# Patient Record
Sex: Female | Born: 1935
Health system: Southern US, Community
[De-identification: ages and names within clinical notes are randomized; demographics above are authoritative.]

## PROBLEM LIST (undated history)

## (undated) DIAGNOSIS — M1990 Unspecified osteoarthritis, unspecified site: Secondary | ICD-10-CM

## (undated) DIAGNOSIS — I10 Essential (primary) hypertension: Secondary | ICD-10-CM

## (undated) DIAGNOSIS — I82409 Acute embolism and thrombosis of unspecified deep veins of unspecified lower extremity: Secondary | ICD-10-CM

## (undated) HISTORY — PX: OTHER SURGICAL HISTORY: SHX169

## (undated) HISTORY — DX: Essential (primary) hypertension: I10

---

## 1973-07-15 HISTORY — PX: CERVICAL DISC SURGERY: SHX588

## 1974-07-15 HISTORY — PX: OTHER SURGICAL HISTORY: SHX169

## 1998-10-31 ENCOUNTER — Other Ambulatory Visit: Admission: RE | Admit: 1998-10-31 | Discharge: 1998-10-31 | Payer: Self-pay | Admitting: Obstetrics and Gynecology

## 1999-10-31 ENCOUNTER — Other Ambulatory Visit: Admission: RE | Admit: 1999-10-31 | Discharge: 1999-10-31 | Payer: Self-pay | Admitting: Obstetrics and Gynecology

## 2000-03-14 ENCOUNTER — Encounter: Admission: RE | Admit: 2000-03-14 | Discharge: 2000-03-14 | Payer: Self-pay | Admitting: *Deleted

## 2000-11-03 ENCOUNTER — Encounter (INDEPENDENT_AMBULATORY_CARE_PROVIDER_SITE_OTHER): Payer: Self-pay | Admitting: Specialist

## 2000-11-03 ENCOUNTER — Other Ambulatory Visit: Admission: RE | Admit: 2000-11-03 | Discharge: 2000-11-03 | Payer: Self-pay | Admitting: Obstetrics and Gynecology

## 2001-03-31 ENCOUNTER — Encounter: Payer: Self-pay | Admitting: Obstetrics and Gynecology

## 2001-03-31 ENCOUNTER — Encounter: Admission: RE | Admit: 2001-03-31 | Discharge: 2001-03-31 | Payer: Self-pay | Admitting: Obstetrics and Gynecology

## 2002-04-01 ENCOUNTER — Encounter: Payer: Self-pay | Admitting: Obstetrics and Gynecology

## 2002-04-01 ENCOUNTER — Encounter: Admission: RE | Admit: 2002-04-01 | Discharge: 2002-04-01 | Payer: Self-pay | Admitting: Obstetrics and Gynecology

## 2003-04-05 ENCOUNTER — Encounter: Payer: Self-pay | Admitting: Obstetrics and Gynecology

## 2003-04-05 ENCOUNTER — Ambulatory Visit (HOSPITAL_COMMUNITY): Admission: RE | Admit: 2003-04-05 | Discharge: 2003-04-05 | Payer: Self-pay | Admitting: Obstetrics and Gynecology

## 2004-04-10 ENCOUNTER — Ambulatory Visit (HOSPITAL_COMMUNITY): Admission: RE | Admit: 2004-04-10 | Discharge: 2004-04-10 | Payer: Self-pay | Admitting: Family Medicine

## 2005-05-30 ENCOUNTER — Ambulatory Visit (HOSPITAL_COMMUNITY): Admission: RE | Admit: 2005-05-30 | Discharge: 2005-05-30 | Payer: Self-pay | Admitting: Family Medicine

## 2006-06-02 ENCOUNTER — Ambulatory Visit (HOSPITAL_COMMUNITY): Admission: RE | Admit: 2006-06-02 | Discharge: 2006-06-02 | Payer: Self-pay | Admitting: Obstetrics and Gynecology

## 2006-10-21 ENCOUNTER — Other Ambulatory Visit: Admission: RE | Admit: 2006-10-21 | Discharge: 2006-10-21 | Payer: Self-pay | Admitting: Obstetrics and Gynecology

## 2007-06-08 ENCOUNTER — Ambulatory Visit (HOSPITAL_COMMUNITY): Admission: RE | Admit: 2007-06-08 | Discharge: 2007-06-08 | Payer: Self-pay | Admitting: Family Medicine

## 2008-04-01 ENCOUNTER — Ambulatory Visit: Payer: Self-pay | Admitting: Internal Medicine

## 2008-04-15 ENCOUNTER — Encounter: Payer: Self-pay | Admitting: Internal Medicine

## 2008-04-15 ENCOUNTER — Ambulatory Visit: Payer: Self-pay | Admitting: Internal Medicine

## 2008-04-18 ENCOUNTER — Encounter: Payer: Self-pay | Admitting: Internal Medicine

## 2008-06-13 ENCOUNTER — Ambulatory Visit (HOSPITAL_COMMUNITY): Admission: RE | Admit: 2008-06-13 | Discharge: 2008-06-13 | Payer: Self-pay | Admitting: Obstetrics and Gynecology

## 2009-06-16 ENCOUNTER — Ambulatory Visit (HOSPITAL_COMMUNITY): Admission: RE | Admit: 2009-06-16 | Discharge: 2009-06-16 | Payer: Self-pay | Admitting: Obstetrics and Gynecology

## 2009-07-12 ENCOUNTER — Ambulatory Visit: Payer: Self-pay | Admitting: Obstetrics and Gynecology

## 2009-07-12 ENCOUNTER — Other Ambulatory Visit: Admission: RE | Admit: 2009-07-12 | Discharge: 2009-07-12 | Payer: Self-pay | Admitting: Obstetrics and Gynecology

## 2009-09-20 ENCOUNTER — Emergency Department (HOSPITAL_BASED_OUTPATIENT_CLINIC_OR_DEPARTMENT_OTHER): Admission: EM | Admit: 2009-09-20 | Discharge: 2009-09-20 | Payer: Self-pay | Admitting: Emergency Medicine

## 2010-07-17 ENCOUNTER — Ambulatory Visit (HOSPITAL_COMMUNITY): Admission: RE | Admit: 2010-07-17 | Payer: Self-pay | Source: Home / Self Care | Admitting: Obstetrics and Gynecology

## 2010-08-09 ENCOUNTER — Other Ambulatory Visit: Payer: Self-pay | Admitting: Obstetrics and Gynecology

## 2010-08-09 DIAGNOSIS — Z139 Encounter for screening, unspecified: Secondary | ICD-10-CM

## 2010-08-14 ENCOUNTER — Ambulatory Visit (HOSPITAL_COMMUNITY)
Admission: RE | Admit: 2010-08-14 | Discharge: 2010-08-14 | Payer: Self-pay | Source: Home / Self Care | Attending: Obstetrics and Gynecology | Admitting: Obstetrics and Gynecology

## 2010-08-15 ENCOUNTER — Encounter: Payer: Self-pay | Admitting: Obstetrics and Gynecology

## 2010-10-08 LAB — CBC
MCV: 91 fL (ref 78.0–100.0)
RBC: 4.43 MIL/uL (ref 3.87–5.11)
WBC: 6.1 10*3/uL (ref 4.0–10.5)

## 2010-10-08 LAB — URINALYSIS, ROUTINE W REFLEX MICROSCOPIC
Bilirubin Urine: NEGATIVE
Hgb urine dipstick: NEGATIVE
Ketones, ur: NEGATIVE mg/dL
Specific Gravity, Urine: 1.014 (ref 1.005–1.030)

## 2010-10-08 LAB — COMPREHENSIVE METABOLIC PANEL
AST: 25 U/L (ref 0–37)
Alkaline Phosphatase: 99 U/L (ref 39–117)
BUN: 17 mg/dL (ref 6–23)
Chloride: 97 mEq/L (ref 96–112)
GFR calc Af Amer: >60 mL/min (ref >60)
Potassium: 4.4 mEq/L (ref 3.5–5.1)
Total Bilirubin: 0.8 mg/dL (ref 0.3–1.2)

## 2010-10-08 LAB — DIFFERENTIAL
Basophils Absolute: 0 10*3/uL (ref 0.0–0.1)
Lymphocytes Relative: 19 % (ref 12–46)
Lymphs Abs: 1.1 10*3/uL (ref 0.7–4.0)
Monocytes Relative: 5 % (ref 3–12)
Neutro Abs: 4.4 10*3/uL (ref 1.7–7.7)
Neutrophils Relative %: 73 % (ref 43–77)

## 2010-10-08 LAB — POCT CARDIAC MARKERS
CKMB, poc: 1.1 ng/mL (ref 1.0–8.0)
Troponin i, poc: <0.05 ng/mL (ref 0.00–0.09)

## 2011-07-30 DIAGNOSIS — J209 Acute bronchitis, unspecified: Secondary | ICD-10-CM | POA: Diagnosis not present

## 2011-07-30 DIAGNOSIS — Z1331 Encounter for screening for depression: Secondary | ICD-10-CM | POA: Diagnosis not present

## 2011-08-30 ENCOUNTER — Other Ambulatory Visit (HOSPITAL_COMMUNITY): Payer: Self-pay | Admitting: Family Medicine

## 2011-08-30 DIAGNOSIS — Z1231 Encounter for screening mammogram for malignant neoplasm of breast: Secondary | ICD-10-CM

## 2011-09-05 ENCOUNTER — Ambulatory Visit (HOSPITAL_COMMUNITY)
Admission: RE | Admit: 2011-09-05 | Discharge: 2011-09-05 | Disposition: A | Discharge status: Home / Self Care | Payer: Medicare Other | Source: Ambulatory Visit | Attending: Family Medicine | Admitting: Family Medicine

## 2011-09-05 DIAGNOSIS — Z1231 Encounter for screening mammogram for malignant neoplasm of breast: Secondary | ICD-10-CM | POA: Insufficient documentation

## 2011-10-04 DIAGNOSIS — K5900 Constipation, unspecified: Secondary | ICD-10-CM | POA: Diagnosis not present

## 2011-10-04 DIAGNOSIS — I10 Essential (primary) hypertension: Secondary | ICD-10-CM | POA: Diagnosis not present

## 2011-10-04 DIAGNOSIS — Z780 Asymptomatic menopausal state: Secondary | ICD-10-CM | POA: Diagnosis not present

## 2011-10-29 DIAGNOSIS — Z780 Asymptomatic menopausal state: Secondary | ICD-10-CM | POA: Diagnosis not present

## 2012-01-17 DIAGNOSIS — H04129 Dry eye syndrome of unspecified lacrimal gland: Secondary | ICD-10-CM | POA: Diagnosis not present

## 2012-01-17 DIAGNOSIS — H2510 Age-related nuclear cataract, unspecified eye: Secondary | ICD-10-CM | POA: Diagnosis not present

## 2012-03-30 DIAGNOSIS — Z23 Encounter for immunization: Secondary | ICD-10-CM | POA: Diagnosis not present

## 2012-03-30 DIAGNOSIS — I10 Essential (primary) hypertension: Secondary | ICD-10-CM | POA: Diagnosis not present

## 2012-03-30 DIAGNOSIS — F411 Generalized anxiety disorder: Secondary | ICD-10-CM | POA: Diagnosis not present

## 2012-09-09 ENCOUNTER — Other Ambulatory Visit (HOSPITAL_COMMUNITY): Payer: Self-pay | Admitting: Family Medicine

## 2012-09-09 DIAGNOSIS — Z803 Family history of malignant neoplasm of breast: Secondary | ICD-10-CM

## 2012-09-16 ENCOUNTER — Ambulatory Visit (HOSPITAL_COMMUNITY)
Admission: RE | Admit: 2012-09-16 | Discharge: 2012-09-16 | Disposition: A | Discharge status: Home / Self Care | Payer: Medicare Other | Source: Ambulatory Visit | Attending: Family Medicine | Admitting: Family Medicine

## 2012-09-16 DIAGNOSIS — Z803 Family history of malignant neoplasm of breast: Secondary | ICD-10-CM

## 2012-09-16 DIAGNOSIS — Z1231 Encounter for screening mammogram for malignant neoplasm of breast: Secondary | ICD-10-CM | POA: Diagnosis not present

## 2012-09-28 DIAGNOSIS — M1710 Unilateral primary osteoarthritis, unspecified knee: Secondary | ICD-10-CM | POA: Diagnosis not present

## 2012-09-28 DIAGNOSIS — IMO0002 Reserved for concepts with insufficient information to code with codable children: Secondary | ICD-10-CM | POA: Diagnosis not present

## 2012-09-28 DIAGNOSIS — I10 Essential (primary) hypertension: Secondary | ICD-10-CM | POA: Diagnosis not present

## 2013-03-02 ENCOUNTER — Encounter: Payer: Self-pay | Admitting: Internal Medicine

## 2013-03-04 ENCOUNTER — Encounter: Payer: Self-pay | Admitting: Internal Medicine

## 2013-04-05 DIAGNOSIS — I10 Essential (primary) hypertension: Secondary | ICD-10-CM | POA: Diagnosis not present

## 2013-04-05 DIAGNOSIS — E7800 Pure hypercholesterolemia, unspecified: Secondary | ICD-10-CM | POA: Diagnosis not present

## 2013-04-05 DIAGNOSIS — IMO0002 Reserved for concepts with insufficient information to code with codable children: Secondary | ICD-10-CM | POA: Diagnosis not present

## 2013-04-05 DIAGNOSIS — M1710 Unilateral primary osteoarthritis, unspecified knee: Secondary | ICD-10-CM | POA: Diagnosis not present

## 2013-04-05 DIAGNOSIS — Z23 Encounter for immunization: Secondary | ICD-10-CM | POA: Diagnosis not present

## 2013-04-26 ENCOUNTER — Ambulatory Visit (AMBULATORY_SURGERY_CENTER): Payer: Self-pay

## 2013-04-26 VITALS — Ht 68.0 in | Wt 174.0 lb

## 2013-04-26 DIAGNOSIS — Z860100 Personal history of colon polyps, unspecified: Secondary | ICD-10-CM

## 2013-04-26 MED ORDER — MOVIPREP 100 G PO SOLR: 1.0000 | Freq: Once | ORAL | Status: DC

## 2013-05-20 ENCOUNTER — Telehealth: Payer: Self-pay | Admitting: Internal Medicine

## 2013-05-20 NOTE — Telephone Encounter (Signed)
No answer. Will call pt back

## 2013-05-20 NOTE — Telephone Encounter (Signed)
Questions were answered about medications to take day of procedure.

## 2013-05-21 ENCOUNTER — Encounter: Payer: PRIVATE HEALTH INSURANCE | Admitting: Internal Medicine

## 2013-05-25 ENCOUNTER — Ambulatory Visit (AMBULATORY_SURGERY_CENTER): Payer: Medicare Other | Admitting: Internal Medicine

## 2013-05-25 ENCOUNTER — Encounter: Payer: Self-pay | Admitting: Internal Medicine

## 2013-05-25 VITALS — BP 161/81 | HR 67 | Temp 97.8°F | Resp 21 | Ht 68.0 in | Wt 174.0 lb

## 2013-05-25 DIAGNOSIS — Z86010 Personal history of colonic polyps: Secondary | ICD-10-CM

## 2013-05-25 DIAGNOSIS — I10 Essential (primary) hypertension: Secondary | ICD-10-CM | POA: Diagnosis not present

## 2013-05-25 DIAGNOSIS — D126 Benign neoplasm of colon, unspecified: Secondary | ICD-10-CM | POA: Diagnosis not present

## 2013-05-25 MED ORDER — SODIUM CHLORIDE 0.9 % IV SOLN: 500.0000 mL | INTRAVENOUS | Status: DC

## 2013-05-25 NOTE — Progress Notes (Signed)
Called to room to assist during endoscopic procedure.  Patient ID and intended procedure confirmed with present staff. Received instructions for my participation in the procedure from the performing physician.  

## 2013-05-25 NOTE — Patient Instructions (Addendum)
YOU HAD AN ENDOSCOPIC PROCEDURE TODAY AT THE [facility]: Refer to the procedure report that was given to you for any specific questions about what was found during the examination.  If the procedure report does not answer your questions, please call your gastroenterologist to clarify.  If you requested that your care partner not be given the details of your procedure findings, then the procedure report has been included in a sealed envelope for you to review at your convenience later.  YOU SHOULD EXPECT: Some feelings of bloating in the abdomen. Passage of more gas than usual.  Walking can help get rid of the air that was put into your GI tract during the procedure and reduce the bloating. If you had a lower endoscopy (such as a colonoscopy or flexible sigmoidoscopy) you may notice spotting of blood in your stool or on the toilet paper. If you underwent a bowel prep for your procedure, then you may not have a normal bowel movement for a few days.  DIET: Your first meal following the procedure should be a light meal and then it is ok to progress to your normal diet.  A half-sandwich or bowl of soup is an example of a good first meal.  Heavy or fried foods are harder to digest and may make you feel nauseous or bloated.  Likewise meals heavy in dairy and vegetables can cause extra gas to form and this can also increase the bloating.  Drink plenty of fluids but you should avoid alcoholic beverages for 24 hours.  ACTIVITY: Your care partner should take you home directly after the procedure.  You should plan to take it easy, moving slowly for the rest of the day.  You can resume normal activity the day after the procedure however you should NOT DRIVE or use heavy machinery for 24 hours (because of the sedation medicines used during the test).    SYMPTOMS TO REPORT IMMEDIATELY: A gastroenterologist can be reached at any hour.  During normal business hours, 8:30 AM to 5:00 PM Monday through Friday,  call [phone].  After hours and on weekends, please call the GI answering service at [phone]  Emergency number who will take a message and have the physician on call contact you.   Following lower endoscopy (colonoscopy or flexible sigmoidoscopy):  Excessive amounts of blood in the stool  Significant tenderness or worsening of abdominal pains  Swelling of the abdomen that is new, acute  Fever of 100F or higher  FOLLOW UP: If any biopsies were taken you will be contacted by phone or by letter within the next 1-3 weeks.  Call your gastroenterologist if you have not heard about the biopsies in 3 weeks.  Our staff will call the home number listed on your records the next business day following your procedure to check on you and address any questions or concerns that you may have at that time regarding the information given to you following your procedure. This is a courtesy call and so if there is no answer at the home number and we have not heard from you through the emergency physician on call, we will assume that you have returned to your regular daily activities without incident.  SIGNATURES/CONFIDENTIALITY: You and/or your care partner have signed paperwork which will be entered into your electronic medical record.  These signatures attest to the fact that that the information above on your After Visit Summary has been reviewed and is understood.  Full responsibility of the   confidentiality of this discharge information lies with you and/or your care-partner.  high fiber diet handout  No recall colonoscopy due to age

## 2013-05-25 NOTE — Op Note (Signed)
[facility] [address] [place], [address]   COLONOSCOPY PROCEDURE REPORT  PATIENT: Michelle, Mosley  MR#: [id number] BIRTHDATE: 1932/08/01 , 80  yrs. old GENDER: Female ENDOSCOPIST: Hart Carwin, MD REFERRED XB:JYNWGN Foy [place], M.D. PROCEDURE DATE:  05/25/2013 PROCEDURE:   Colonoscopy with snare polypectomy First Screening Colonoscopy - Avg.  risk and is 50 yrs.  old or older - No.  Prior Negative Screening - Now for repeat screening. N/A  History of Adenoma - Now for follow-up colonoscopy & has been > or = to 3 yrs.  Yes hx of adenoma.  Has been 3 or more years since last colonoscopy.  Polyps Removed Today? Yes. ASA CLASS:   Class II INDICATIONS:Patient's immediate family history of colon cancer and adenomatous polyp removed in 2004.  Last colonoscopy October 2009 showed tubular adenoma. MEDICATIONS: MAC sedation, administered by CRNA and propofol (Diprivan) 200mg  IV  DESCRIPTION OF PROCEDURE:   After the risks benefits and alternatives of the procedure were thoroughly explained, informed consent was obtained.  A digital rectal exam revealed no abnormalities of the rectum.   The LB [id number]-[id number]  endoscope was introduced through the anus and advanced to the cecum, which was identified by both the appendix and ileocecal valve. No adverse events experienced.   The quality of the prep was good, using MoviPrep  The instrument was then slowly withdrawn as the colon was fully examined.      COLON FINDINGS: A polypoid shaped semi-pedunculated polyp ranging between 3-69mm in size was found in the descending colon.  A polypectomy was performed with a cold snare.  The resection was complete and the polyp tissue was completely retrieved. Retroflexed views revealed no abnormalities. The time to cecum=11 minutes 19 seconds.  Withdrawal time=6 minutes 1 seconds.  The scope was withdrawn and the procedure completed. COMPLICATIONS: There were no  complications.  ENDOSCOPIC IMPRESSION: Semi-pedunculated polyp ranging between 3-27mm in size was found in the descending colon; polypectomy was performed with a cold snare  RECOMMENDATIONS: 1.  Await pathology results 2.   high-fiber diet No recall colonoscopy due to age   eSigned:  Hart Carwin, MD 05/25/2013 11:42 AM   cc:

## 2013-05-25 NOTE — Progress Notes (Signed)
Patient did not experience any of the following events: a burn prior to discharge; a fall within the facility; wrong site/side/patient/procedure/implant event; or a hospital transfer or hospital admission upon discharge from the facility. (G8907)Patient did not have preoperative order for IV antibiotic SSI prophylaxis. (G8918) ewm 

## 2013-05-26 ENCOUNTER — Encounter: Payer: Self-pay | Admitting: *Deleted

## 2013-05-26 ENCOUNTER — Telehealth: Payer: Self-pay | Admitting: *Deleted

## 2013-05-26 DIAGNOSIS — M1710 Unilateral primary osteoarthritis, unspecified knee: Secondary | ICD-10-CM | POA: Diagnosis not present

## 2013-05-26 NOTE — Telephone Encounter (Signed)
No answer. No identifier. Message left to call if questions or concerns. 

## 2013-05-26 NOTE — Telephone Encounter (Signed)
Erroneous encounter

## 2013-05-31 ENCOUNTER — Encounter: Payer: Self-pay | Admitting: Internal Medicine

## 2013-09-13 DIAGNOSIS — R0989 Other specified symptoms and signs involving the circulatory and respiratory systems: Secondary | ICD-10-CM | POA: Diagnosis not present

## 2013-09-13 DIAGNOSIS — I10 Essential (primary) hypertension: Secondary | ICD-10-CM | POA: Diagnosis not present

## 2013-09-16 ENCOUNTER — Other Ambulatory Visit: Payer: Self-pay | Admitting: Family Medicine

## 2013-09-16 DIAGNOSIS — R0989 Other specified symptoms and signs involving the circulatory and respiratory systems: Secondary | ICD-10-CM

## 2013-09-20 ENCOUNTER — Ambulatory Visit
Admission: RE | Admit: 2013-09-20 | Discharge: 2013-09-20 | Disposition: A | Discharge status: Home / Self Care | Payer: Medicare Other | Source: Ambulatory Visit | Attending: Family Medicine | Admitting: Family Medicine

## 2013-09-20 DIAGNOSIS — R0989 Other specified symptoms and signs involving the circulatory and respiratory systems: Secondary | ICD-10-CM | POA: Diagnosis not present

## 2013-09-27 DIAGNOSIS — J00 Acute nasopharyngitis [common cold]: Secondary | ICD-10-CM | POA: Diagnosis not present

## 2013-09-27 DIAGNOSIS — I10 Essential (primary) hypertension: Secondary | ICD-10-CM | POA: Diagnosis not present

## 2013-10-25 ENCOUNTER — Other Ambulatory Visit (HOSPITAL_COMMUNITY): Payer: Self-pay | Admitting: Family Medicine

## 2013-10-25 DIAGNOSIS — Z1231 Encounter for screening mammogram for malignant neoplasm of breast: Secondary | ICD-10-CM

## 2013-10-29 ENCOUNTER — Ambulatory Visit (HOSPITAL_COMMUNITY)
Admission: RE | Admit: 2013-10-29 | Discharge: 2013-10-29 | Disposition: A | Discharge status: Home / Self Care | Payer: Medicare Other | Source: Ambulatory Visit | Attending: Family Medicine | Admitting: Family Medicine

## 2013-10-29 DIAGNOSIS — Z1231 Encounter for screening mammogram for malignant neoplasm of breast: Secondary | ICD-10-CM | POA: Diagnosis not present

## 2013-11-08 DIAGNOSIS — I10 Essential (primary) hypertension: Secondary | ICD-10-CM | POA: Diagnosis not present

## 2013-11-08 DIAGNOSIS — F411 Generalized anxiety disorder: Secondary | ICD-10-CM | POA: Diagnosis not present

## 2013-12-07 DIAGNOSIS — M25569 Pain in unspecified knee: Secondary | ICD-10-CM | POA: Diagnosis not present

## 2014-01-05 DIAGNOSIS — L820 Inflamed seborrheic keratosis: Secondary | ICD-10-CM | POA: Diagnosis not present

## 2014-01-05 DIAGNOSIS — L570 Actinic keratosis: Secondary | ICD-10-CM | POA: Diagnosis not present

## 2014-01-05 DIAGNOSIS — I781 Nevus, non-neoplastic: Secondary | ICD-10-CM | POA: Diagnosis not present

## 2014-03-23 DIAGNOSIS — H04129 Dry eye syndrome of unspecified lacrimal gland: Secondary | ICD-10-CM | POA: Diagnosis not present

## 2014-03-23 DIAGNOSIS — H2510 Age-related nuclear cataract, unspecified eye: Secondary | ICD-10-CM | POA: Diagnosis not present

## 2014-05-09 DIAGNOSIS — I10 Essential (primary) hypertension: Secondary | ICD-10-CM | POA: Diagnosis not present

## 2014-05-09 DIAGNOSIS — Z23 Encounter for immunization: Secondary | ICD-10-CM | POA: Diagnosis not present

## 2014-05-09 DIAGNOSIS — M170 Bilateral primary osteoarthritis of knee: Secondary | ICD-10-CM | POA: Diagnosis not present

## 2014-06-17 DIAGNOSIS — R1013 Epigastric pain: Secondary | ICD-10-CM | POA: Diagnosis not present

## 2014-06-17 DIAGNOSIS — I10 Essential (primary) hypertension: Secondary | ICD-10-CM | POA: Diagnosis not present

## 2014-08-01 DIAGNOSIS — M170 Bilateral primary osteoarthritis of knee: Secondary | ICD-10-CM | POA: Diagnosis not present

## 2014-08-01 DIAGNOSIS — I10 Essential (primary) hypertension: Secondary | ICD-10-CM | POA: Diagnosis not present

## 2014-09-14 DIAGNOSIS — X32XXXD Exposure to sunlight, subsequent encounter: Secondary | ICD-10-CM | POA: Diagnosis not present

## 2014-09-14 DIAGNOSIS — L820 Inflamed seborrheic keratosis: Secondary | ICD-10-CM | POA: Diagnosis not present

## 2014-09-14 DIAGNOSIS — L570 Actinic keratosis: Secondary | ICD-10-CM | POA: Diagnosis not present

## 2014-10-13 ENCOUNTER — Other Ambulatory Visit (HOSPITAL_COMMUNITY): Payer: Self-pay | Admitting: Family Medicine

## 2014-10-13 DIAGNOSIS — Z1231 Encounter for screening mammogram for malignant neoplasm of breast: Secondary | ICD-10-CM

## 2014-11-02 ENCOUNTER — Ambulatory Visit (HOSPITAL_COMMUNITY)
Admission: RE | Admit: 2014-11-02 | Discharge: 2014-11-02 | Disposition: A | Discharge status: Home / Self Care | Payer: Medicare Other | Source: Ambulatory Visit | Attending: Family Medicine | Admitting: Family Medicine

## 2014-11-02 DIAGNOSIS — Z1231 Encounter for screening mammogram for malignant neoplasm of breast: Secondary | ICD-10-CM

## 2015-01-30 DIAGNOSIS — M170 Bilateral primary osteoarthritis of knee: Secondary | ICD-10-CM | POA: Diagnosis not present

## 2015-01-30 DIAGNOSIS — E780 Pure hypercholesterolemia: Secondary | ICD-10-CM | POA: Diagnosis not present

## 2015-01-30 DIAGNOSIS — L570 Actinic keratosis: Secondary | ICD-10-CM | POA: Diagnosis not present

## 2015-01-30 DIAGNOSIS — I10 Essential (primary) hypertension: Secondary | ICD-10-CM | POA: Diagnosis not present

## 2015-01-30 DIAGNOSIS — F419 Anxiety disorder, unspecified: Secondary | ICD-10-CM | POA: Diagnosis not present

## 2015-03-13 DIAGNOSIS — E780 Pure hypercholesterolemia: Secondary | ICD-10-CM | POA: Diagnosis not present

## 2015-03-13 DIAGNOSIS — L570 Actinic keratosis: Secondary | ICD-10-CM | POA: Diagnosis not present

## 2015-03-13 DIAGNOSIS — I10 Essential (primary) hypertension: Secondary | ICD-10-CM | POA: Diagnosis not present

## 2015-03-29 DIAGNOSIS — H04123 Dry eye syndrome of bilateral lacrimal glands: Secondary | ICD-10-CM | POA: Diagnosis not present

## 2015-03-29 DIAGNOSIS — H2513 Age-related nuclear cataract, bilateral: Secondary | ICD-10-CM | POA: Diagnosis not present

## 2015-05-12 DIAGNOSIS — Z23 Encounter for immunization: Secondary | ICD-10-CM | POA: Diagnosis not present

## 2015-06-02 DIAGNOSIS — R2689 Other abnormalities of gait and mobility: Secondary | ICD-10-CM | POA: Diagnosis not present

## 2015-06-02 DIAGNOSIS — M25561 Pain in right knee: Secondary | ICD-10-CM | POA: Diagnosis not present

## 2015-06-02 DIAGNOSIS — M25562 Pain in left knee: Secondary | ICD-10-CM | POA: Diagnosis not present

## 2015-06-02 DIAGNOSIS — M170 Bilateral primary osteoarthritis of knee: Secondary | ICD-10-CM | POA: Diagnosis not present

## 2015-06-06 DIAGNOSIS — M25562 Pain in left knee: Secondary | ICD-10-CM | POA: Diagnosis not present

## 2015-06-06 DIAGNOSIS — M1712 Unilateral primary osteoarthritis, left knee: Secondary | ICD-10-CM | POA: Diagnosis not present

## 2015-06-06 DIAGNOSIS — M170 Bilateral primary osteoarthritis of knee: Secondary | ICD-10-CM | POA: Diagnosis not present

## 2015-06-06 DIAGNOSIS — M25561 Pain in right knee: Secondary | ICD-10-CM | POA: Diagnosis not present

## 2015-06-07 DIAGNOSIS — M25561 Pain in right knee: Secondary | ICD-10-CM | POA: Diagnosis not present

## 2015-06-07 DIAGNOSIS — M1711 Unilateral primary osteoarthritis, right knee: Secondary | ICD-10-CM | POA: Diagnosis not present

## 2015-06-07 DIAGNOSIS — M170 Bilateral primary osteoarthritis of knee: Secondary | ICD-10-CM | POA: Diagnosis not present

## 2015-06-07 DIAGNOSIS — M25562 Pain in left knee: Secondary | ICD-10-CM | POA: Diagnosis not present

## 2015-06-12 DIAGNOSIS — R2689 Other abnormalities of gait and mobility: Secondary | ICD-10-CM | POA: Diagnosis not present

## 2015-06-12 DIAGNOSIS — M25562 Pain in left knee: Secondary | ICD-10-CM | POA: Diagnosis not present

## 2015-06-12 DIAGNOSIS — M25561 Pain in right knee: Secondary | ICD-10-CM | POA: Diagnosis not present

## 2015-06-12 DIAGNOSIS — M170 Bilateral primary osteoarthritis of knee: Secondary | ICD-10-CM | POA: Diagnosis not present

## 2015-06-12 DIAGNOSIS — M1712 Unilateral primary osteoarthritis, left knee: Secondary | ICD-10-CM | POA: Diagnosis not present

## 2015-06-16 DIAGNOSIS — M1711 Unilateral primary osteoarthritis, right knee: Secondary | ICD-10-CM | POA: Diagnosis not present

## 2015-06-16 DIAGNOSIS — M170 Bilateral primary osteoarthritis of knee: Secondary | ICD-10-CM | POA: Diagnosis not present

## 2015-06-16 DIAGNOSIS — M25562 Pain in left knee: Secondary | ICD-10-CM | POA: Diagnosis not present

## 2015-06-16 DIAGNOSIS — M25561 Pain in right knee: Secondary | ICD-10-CM | POA: Diagnosis not present

## 2015-06-19 DIAGNOSIS — M1712 Unilateral primary osteoarthritis, left knee: Secondary | ICD-10-CM | POA: Diagnosis not present

## 2015-06-19 DIAGNOSIS — M25562 Pain in left knee: Secondary | ICD-10-CM | POA: Diagnosis not present

## 2015-06-19 DIAGNOSIS — M170 Bilateral primary osteoarthritis of knee: Secondary | ICD-10-CM | POA: Diagnosis not present

## 2015-06-19 DIAGNOSIS — M25561 Pain in right knee: Secondary | ICD-10-CM | POA: Diagnosis not present

## 2015-06-23 DIAGNOSIS — M25561 Pain in right knee: Secondary | ICD-10-CM | POA: Diagnosis not present

## 2015-06-23 DIAGNOSIS — M25562 Pain in left knee: Secondary | ICD-10-CM | POA: Diagnosis not present

## 2015-06-23 DIAGNOSIS — M1711 Unilateral primary osteoarthritis, right knee: Secondary | ICD-10-CM | POA: Diagnosis not present

## 2015-06-23 DIAGNOSIS — M170 Bilateral primary osteoarthritis of knee: Secondary | ICD-10-CM | POA: Diagnosis not present

## 2015-06-26 DIAGNOSIS — M1712 Unilateral primary osteoarthritis, left knee: Secondary | ICD-10-CM | POA: Diagnosis not present

## 2015-06-26 DIAGNOSIS — M170 Bilateral primary osteoarthritis of knee: Secondary | ICD-10-CM | POA: Diagnosis not present

## 2015-06-26 DIAGNOSIS — M25562 Pain in left knee: Secondary | ICD-10-CM | POA: Diagnosis not present

## 2015-06-26 DIAGNOSIS — M25561 Pain in right knee: Secondary | ICD-10-CM | POA: Diagnosis not present

## 2015-06-30 DIAGNOSIS — M1711 Unilateral primary osteoarthritis, right knee: Secondary | ICD-10-CM | POA: Diagnosis not present

## 2015-06-30 DIAGNOSIS — M25561 Pain in right knee: Secondary | ICD-10-CM | POA: Diagnosis not present

## 2015-08-14 DIAGNOSIS — I10 Essential (primary) hypertension: Secondary | ICD-10-CM | POA: Diagnosis not present

## 2015-08-14 DIAGNOSIS — M170 Bilateral primary osteoarthritis of knee: Secondary | ICD-10-CM | POA: Diagnosis not present

## 2015-10-03 DIAGNOSIS — D225 Melanocytic nevi of trunk: Secondary | ICD-10-CM | POA: Diagnosis not present

## 2015-10-03 DIAGNOSIS — L219 Seborrheic dermatitis, unspecified: Secondary | ICD-10-CM | POA: Diagnosis not present

## 2015-10-03 DIAGNOSIS — L821 Other seborrheic keratosis: Secondary | ICD-10-CM | POA: Diagnosis not present

## 2015-11-06 DIAGNOSIS — M25562 Pain in left knee: Secondary | ICD-10-CM | POA: Diagnosis not present

## 2015-11-06 DIAGNOSIS — M170 Bilateral primary osteoarthritis of knee: Secondary | ICD-10-CM | POA: Diagnosis not present

## 2015-11-06 DIAGNOSIS — M25561 Pain in right knee: Secondary | ICD-10-CM | POA: Diagnosis not present

## 2015-12-27 ENCOUNTER — Other Ambulatory Visit: Payer: Self-pay | Admitting: Physician Assistant

## 2015-12-27 DIAGNOSIS — Z803 Family history of malignant neoplasm of breast: Secondary | ICD-10-CM

## 2015-12-27 DIAGNOSIS — Z1231 Encounter for screening mammogram for malignant neoplasm of breast: Secondary | ICD-10-CM

## 2016-01-10 ENCOUNTER — Ambulatory Visit
Admission: RE | Admit: 2016-01-10 | Discharge: 2016-01-10 | Disposition: A | Discharge status: Home / Self Care | Payer: Medicare Other | Source: Ambulatory Visit | Attending: Physician Assistant | Admitting: Physician Assistant

## 2016-01-10 DIAGNOSIS — Z803 Family history of malignant neoplasm of breast: Secondary | ICD-10-CM

## 2016-01-10 DIAGNOSIS — Z1231 Encounter for screening mammogram for malignant neoplasm of breast: Secondary | ICD-10-CM | POA: Diagnosis not present

## 2016-01-22 DIAGNOSIS — I10 Essential (primary) hypertension: Secondary | ICD-10-CM | POA: Diagnosis not present

## 2016-01-22 DIAGNOSIS — F419 Anxiety disorder, unspecified: Secondary | ICD-10-CM | POA: Diagnosis not present

## 2016-01-22 DIAGNOSIS — Z86718 Personal history of other venous thrombosis and embolism: Secondary | ICD-10-CM | POA: Diagnosis not present

## 2016-02-05 DIAGNOSIS — E2839 Other primary ovarian failure: Secondary | ICD-10-CM | POA: Diagnosis not present

## 2016-03-29 DIAGNOSIS — Z23 Encounter for immunization: Secondary | ICD-10-CM | POA: Diagnosis not present

## 2016-05-14 ENCOUNTER — Other Ambulatory Visit: Payer: Self-pay | Admitting: *Deleted

## 2016-05-14 DIAGNOSIS — R609 Edema, unspecified: Secondary | ICD-10-CM

## 2016-06-19 ENCOUNTER — Encounter: Payer: Self-pay | Admitting: Vascular Surgery

## 2016-06-23 DIAGNOSIS — R05 Cough: Secondary | ICD-10-CM | POA: Diagnosis not present

## 2016-06-23 DIAGNOSIS — J209 Acute bronchitis, unspecified: Secondary | ICD-10-CM | POA: Diagnosis not present

## 2016-06-25 ENCOUNTER — Encounter (HOSPITAL_COMMUNITY): Payer: Medicare Other

## 2016-06-25 ENCOUNTER — Encounter: Payer: Medicare Other | Admitting: Vascular Surgery

## 2016-07-22 DIAGNOSIS — J019 Acute sinusitis, unspecified: Secondary | ICD-10-CM | POA: Diagnosis not present

## 2016-07-22 DIAGNOSIS — I10 Essential (primary) hypertension: Secondary | ICD-10-CM | POA: Diagnosis not present

## 2016-07-22 DIAGNOSIS — E7800 Pure hypercholesterolemia, unspecified: Secondary | ICD-10-CM | POA: Diagnosis not present

## 2016-08-13 ENCOUNTER — Encounter: Payer: Self-pay | Admitting: Vascular Surgery

## 2016-08-20 ENCOUNTER — Ambulatory Visit (HOSPITAL_COMMUNITY)
Admission: RE | Admit: 2016-08-20 | Discharge: 2016-08-20 | Disposition: A | Discharge status: Home / Self Care | Payer: Medicare Other | Source: Ambulatory Visit | Attending: Vascular Surgery | Admitting: Vascular Surgery

## 2016-08-20 ENCOUNTER — Ambulatory Visit (INDEPENDENT_AMBULATORY_CARE_PROVIDER_SITE_OTHER): Payer: Medicare Other | Admitting: Vascular Surgery

## 2016-08-20 ENCOUNTER — Encounter: Payer: Self-pay | Admitting: Vascular Surgery

## 2016-08-20 VITALS — BP 148/84 | HR 75 | Temp 97.2°F | Resp 18 | Ht 66.5 in | Wt 172.1 lb

## 2016-08-20 DIAGNOSIS — I8393 Asymptomatic varicose veins of bilateral lower extremities: Secondary | ICD-10-CM | POA: Diagnosis not present

## 2016-08-20 DIAGNOSIS — M7989 Other specified soft tissue disorders: Secondary | ICD-10-CM

## 2016-08-20 DIAGNOSIS — I82431 Acute embolism and thrombosis of right popliteal vein: Secondary | ICD-10-CM | POA: Insufficient documentation

## 2016-08-20 DIAGNOSIS — I82411 Acute embolism and thrombosis of right femoral vein: Secondary | ICD-10-CM | POA: Insufficient documentation

## 2016-08-20 DIAGNOSIS — R609 Edema, unspecified: Secondary | ICD-10-CM

## 2016-08-20 NOTE — Progress Notes (Signed)
HISTORY AND PHYSICAL     CC:  Right leg swelling  Referring Provider:  Briscoe Deutscher, MD  HPI: This is a 83 y.o. female who presents today with c/o right leg swelling.  She states that she had a blood clot in the right leg back in 1998 and saw Dr. Kellie Simmering at that time.  She was placed on a 325 mg aspirin and advised to wear 20-38mmHg thigh high compression stockings. She has a hx of 3 pregnancies.  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   She states that she used to do a lot of walking/hiking, however, her knees are bone on bone and she can no longer do this.  She does walk her dog at least 3x/day.  [facility]  She has not been doing this since December due to chronic bronchitis.  She hopes to restart this in the near future when it gets a little warmer.   She continues to take an aspirin daily.  She is on a beta blocker, ARB, and CCB for blood pressure management.   Past Medical History:  Diagnosis Date  . Hypertension     Past Surgical History:  Procedure Laterality Date  . broken neck  1970's  . [place]  . excision of breast cyst Left     No Known Allergies  Current Outpatient Prescriptions  Medication Sig Dispense Refill  . AMLODIPINE BESYLATE PO Take 5 mg by mouth daily.    Marland Kitchen aspirin 325 MG tablet Take 325 mg by mouth daily.    . carvedilol (COREG CR) 20 MG 24 hr capsule Take 20 mg by mouth daily.    . Cholecalciferol (VITAMIN D) 2000 units CAPS Take by mouth daily.    . Multiple Vitamin (MULTIVITAMIN) tablet Take 1 tablet by mouth daily.    . valsartan-hydrochlorothiazide (DIOVAN-HCT) 320-12.5 MG per tablet Take 1 tablet by mouth daily.    . sertraline (ZOLOFT) 50 MG tablet Take 50 mg by mouth daily.     No current facility-administered medications for this visit.     Family History  Problem Relation Age of Onset  . Colon cancer Mother     Social History   Social History  .  Marital status: Widowed    Spouse name: N/A  . Number of children: N/A  . Years of education: N/A   Occupational History  . Not on file.   Social History Main Topics  . Smoking status: Never Smoker  . Smokeless tobacco: Never Used  . Alcohol use No  . Drug use: Unknown  . Sexual activity: Not on file   Other Topics Concern  . Not on file   Social History Narrative  . No narrative on file     REVIEW OF SYSTEMS:   [X]  denotes positive finding, [ ]  denotes negative finding Cardiac  Comments:  Chest pain or chest pressure:    Shortness of breath upon exertion:    Short of breath when lying flat:    Irregular heart rhythm:        Vascular    Pain in calf, thigh, or hip brought on by ambulation:    Pain in feet at night that wakes you up from your sleep:     Blood clot in your veins: x 1998-right leg  Leg swelling:  x       Pulmonary    Oxygen at  home:    Productive cough:     Wheezing:         Neurologic    Sudden weakness in arms or legs:     Sudden numbness in arms or legs:     Sudden onset of difficulty speaking or slurred speech:    Temporary loss of vision in one eye:     Problems with dizziness:         Gastrointestinal    Blood in stool:     Vomited blood:         Genitourinary    Burning when urinating:     Blood in urine:        Psychiatric    Major depression:         Hematologic    Bleeding problems:    Problems with blood clotting too easily:        Skin    Rashes or ulcers:        Constitutional    Fever or chills:      PHYSICAL EXAMINATION:  Vitals:   08/20/16 1623 08/20/16 1626  BP: (!) 152/81 (!) 148/84  Pulse: 75   Resp: 18   Temp: 97.2 F (36.2 C)    Body mass index is 27.36 kg/m.  General:  WDWN in NAD; vital signs documented above Gait: Not observed HENT: WNL, normocephalic Pulmonary: normal non-labored breathing , without Rales, rhonchi,  wheezing Cardiac: regular HR, without  Murmurs, rubs or gallops; without  carotid bruit  Skin: without rashes Vascular Exam/Pulses:  Right Left  Radial 2+ (normal) 2+ (normal)  Popliteal Unable to palpate  Unable to palpate   DP 2+ (normal) 2+ (normal)  PT 1+ (weak) 1+ (weak)   Extremities: without ischemic changes, without Gangrene , without cellulitis; without open wounds; she does have teleangiectasias around her right ankle and foot and scatter teleangiectasias on the leg distal to the knee. Musculoskeletal: no muscle wasting or atrophy  Neurologic: A&O X 3;  No focal weakness or paresthesias are detected Psychiatric:  The pt has Normal affect.   Non-Invasive Vascular Imaging:   Lower extremity venous duplex 08/20/16 1.  Small contracted right femoral vein mid thigh (partial compression with flow) with thrombus in the distal femoral vein (age undetermined) and in the popliteal vein, appears chronic 2.  No DVT in the LLE 3.  Reflux noted throughout the right deep system and in the saphenofemoral junction extending to the medial knee area 4.  Reflux in the right small saphenous vein 5.  Reflux in the left saphenofemoral junction and in the great saphenous vein in the knee and proximal calf area.  7.  No reflux noted in the left small saphenous vein.  Pt meds includes: Statin:  No. Beta Blocker:  Yes.   Aspirin:  Yes.   ACEI:  No. ARB:  Yes.   CCB use:  Yes Other Antiplatelet/Anticoagulant:  No   ASSESSMENT/PLAN:: 83 y.o. female with right leg swelling   -pt with hx of right leg DVT (right thigh) in 1998.  Duplex today shows some chronic residual thrombus. She is on an adult aspirin daily and will continue this.  She does not need any additional anticoagulation for this. -she will continue to wear thigh high compression 20-30mmHg on the right leg. -continue elevation and water aerobics and staying active.  -she will contact us if she has any further issues.    Leontine Locket, PA-C [facility] [phone]  Clinic MD:  Pt  seen and examined with Dr. Donnetta Hutching   I have examined the patient, reviewed and agree with above.Patient presents today for follow-up. She's been extremely compliant for 20 years of a thigh-high graduated compression garments and daily aspirin therapy. No evidence of recurrent DVT. She has maintained minimal changes of post phlebitic syndrome. I did review her duplex showing some chronic changes in her right femoral vein related to her old DVT. She does have some reflux in her superficial veins but would not recommend treatment on this based on her deep venous partial occlusion. I explained that this should put her at no increased risk for more serious venous pathology. She has completely normal arterial flow to her feet bilaterally. She will continue her usual activities and see [place] again as needed  Curt Jews, MD 08/20/2016 5:07 PM

## 2016-12-16 ENCOUNTER — Other Ambulatory Visit: Payer: Self-pay | Admitting: Physician Assistant

## 2016-12-16 DIAGNOSIS — Z1231 Encounter for screening mammogram for malignant neoplasm of breast: Secondary | ICD-10-CM

## 2017-01-20 DIAGNOSIS — I10 Essential (primary) hypertension: Secondary | ICD-10-CM | POA: Diagnosis not present

## 2017-01-20 DIAGNOSIS — J309 Allergic rhinitis, unspecified: Secondary | ICD-10-CM | POA: Diagnosis not present

## 2017-01-20 DIAGNOSIS — Z0000 Encounter for general adult medical examination without abnormal findings: Secondary | ICD-10-CM | POA: Diagnosis not present

## 2017-01-20 DIAGNOSIS — K5900 Constipation, unspecified: Secondary | ICD-10-CM | POA: Diagnosis not present

## 2017-01-22 ENCOUNTER — Ambulatory Visit
Admission: RE | Admit: 2017-01-22 | Discharge: 2017-01-22 | Disposition: A | Discharge status: Home / Self Care | Payer: Medicare Other | Source: Ambulatory Visit | Attending: Physician Assistant | Admitting: Physician Assistant

## 2017-01-22 DIAGNOSIS — Z1231 Encounter for screening mammogram for malignant neoplasm of breast: Secondary | ICD-10-CM | POA: Diagnosis not present

## 2017-02-10 DIAGNOSIS — X32XXXD Exposure to sunlight, subsequent encounter: Secondary | ICD-10-CM | POA: Diagnosis not present

## 2017-02-10 DIAGNOSIS — L820 Inflamed seborrheic keratosis: Secondary | ICD-10-CM | POA: Diagnosis not present

## 2017-02-10 DIAGNOSIS — L570 Actinic keratosis: Secondary | ICD-10-CM | POA: Diagnosis not present

## 2017-04-23 DIAGNOSIS — Z23 Encounter for immunization: Secondary | ICD-10-CM | POA: Diagnosis not present

## 2017-06-03 DIAGNOSIS — H2513 Age-related nuclear cataract, bilateral: Secondary | ICD-10-CM | POA: Diagnosis not present

## 2017-06-03 DIAGNOSIS — H04123 Dry eye syndrome of bilateral lacrimal glands: Secondary | ICD-10-CM | POA: Diagnosis not present

## 2017-06-03 DIAGNOSIS — H2511 Age-related nuclear cataract, right eye: Secondary | ICD-10-CM | POA: Diagnosis not present

## 2017-08-04 DIAGNOSIS — I10 Essential (primary) hypertension: Secondary | ICD-10-CM | POA: Diagnosis not present

## 2017-08-04 DIAGNOSIS — F419 Anxiety disorder, unspecified: Secondary | ICD-10-CM | POA: Diagnosis not present

## 2017-08-04 DIAGNOSIS — E7800 Pure hypercholesterolemia, unspecified: Secondary | ICD-10-CM | POA: Diagnosis not present

## 2017-08-04 DIAGNOSIS — K219 Gastro-esophageal reflux disease without esophagitis: Secondary | ICD-10-CM | POA: Diagnosis not present

## 2017-08-18 DIAGNOSIS — H2511 Age-related nuclear cataract, right eye: Secondary | ICD-10-CM | POA: Diagnosis not present

## 2017-08-19 DIAGNOSIS — H4301 Vitreous prolapse, right eye: Secondary | ICD-10-CM | POA: Diagnosis not present

## 2017-08-19 DIAGNOSIS — H2512 Age-related nuclear cataract, left eye: Secondary | ICD-10-CM | POA: Diagnosis not present

## 2017-08-19 DIAGNOSIS — T8522XA Displacement of intraocular lens, initial encounter: Secondary | ICD-10-CM | POA: Diagnosis not present

## 2017-08-19 DIAGNOSIS — H4302 Vitreous prolapse, left eye: Secondary | ICD-10-CM | POA: Diagnosis not present

## 2017-08-19 DIAGNOSIS — H2701 Aphakia, right eye: Secondary | ICD-10-CM | POA: Diagnosis not present

## 2017-08-19 DIAGNOSIS — Z961 Presence of intraocular lens: Secondary | ICD-10-CM | POA: Diagnosis not present

## 2017-08-21 DIAGNOSIS — T8522XA Displacement of intraocular lens, initial encounter: Secondary | ICD-10-CM | POA: Diagnosis not present

## 2017-09-04 DIAGNOSIS — H4302 Vitreous prolapse, left eye: Secondary | ICD-10-CM | POA: Diagnosis not present

## 2017-09-04 DIAGNOSIS — Z09 Encounter for follow-up examination after completed treatment for conditions other than malignant neoplasm: Secondary | ICD-10-CM | POA: Diagnosis not present

## 2017-09-11 DIAGNOSIS — H353131 Nonexudative age-related macular degeneration, bilateral, early dry stage: Secondary | ICD-10-CM | POA: Diagnosis not present

## 2017-09-21 DIAGNOSIS — H2512 Age-related nuclear cataract, left eye: Secondary | ICD-10-CM | POA: Diagnosis not present

## 2017-09-29 DIAGNOSIS — H2512 Age-related nuclear cataract, left eye: Secondary | ICD-10-CM | POA: Diagnosis not present

## 2017-12-24 DIAGNOSIS — I10 Essential (primary) hypertension: Secondary | ICD-10-CM | POA: Diagnosis not present

## 2017-12-24 DIAGNOSIS — R5383 Other fatigue: Secondary | ICD-10-CM | POA: Diagnosis not present

## 2017-12-24 DIAGNOSIS — R634 Abnormal weight loss: Secondary | ICD-10-CM | POA: Diagnosis not present

## 2017-12-24 DIAGNOSIS — K5900 Constipation, unspecified: Secondary | ICD-10-CM | POA: Diagnosis not present

## 2017-12-24 DIAGNOSIS — Z1389 Encounter for screening for other disorder: Secondary | ICD-10-CM | POA: Diagnosis not present

## 2017-12-24 DIAGNOSIS — Z79899 Other long term (current) drug therapy: Secondary | ICD-10-CM | POA: Diagnosis not present

## 2018-01-14 ENCOUNTER — Other Ambulatory Visit: Payer: Self-pay | Admitting: Geriatric Medicine

## 2018-01-14 DIAGNOSIS — Z1231 Encounter for screening mammogram for malignant neoplasm of breast: Secondary | ICD-10-CM

## 2018-01-28 DIAGNOSIS — Z79899 Other long term (current) drug therapy: Secondary | ICD-10-CM | POA: Diagnosis not present

## 2018-02-04 DIAGNOSIS — M25562 Pain in left knee: Secondary | ICD-10-CM | POA: Diagnosis not present

## 2018-02-04 DIAGNOSIS — M25561 Pain in right knee: Secondary | ICD-10-CM | POA: Diagnosis not present

## 2018-02-04 DIAGNOSIS — H918X9 Other specified hearing loss, unspecified ear: Secondary | ICD-10-CM | POA: Diagnosis not present

## 2018-02-04 DIAGNOSIS — I10 Essential (primary) hypertension: Secondary | ICD-10-CM | POA: Diagnosis not present

## 2018-02-04 DIAGNOSIS — K5900 Constipation, unspecified: Secondary | ICD-10-CM | POA: Diagnosis not present

## 2018-02-04 DIAGNOSIS — G8929 Other chronic pain: Secondary | ICD-10-CM | POA: Diagnosis not present

## 2018-02-04 DIAGNOSIS — H6121 Impacted cerumen, right ear: Secondary | ICD-10-CM | POA: Diagnosis not present

## 2018-02-09 ENCOUNTER — Ambulatory Visit
Admission: RE | Admit: 2018-02-09 | Discharge: 2018-02-09 | Disposition: A | Discharge status: Home / Self Care | Payer: Medicare Other | Source: Ambulatory Visit | Attending: Geriatric Medicine | Admitting: Geriatric Medicine

## 2018-02-09 DIAGNOSIS — Z1231 Encounter for screening mammogram for malignant neoplasm of breast: Secondary | ICD-10-CM

## 2018-02-11 DIAGNOSIS — Z961 Presence of intraocular lens: Secondary | ICD-10-CM | POA: Diagnosis not present

## 2018-02-11 DIAGNOSIS — H35363 Drusen (degenerative) of macula, bilateral: Secondary | ICD-10-CM | POA: Diagnosis not present

## 2018-02-11 DIAGNOSIS — H1820 Unspecified corneal edema: Secondary | ICD-10-CM | POA: Diagnosis not present

## 2018-02-13 DIAGNOSIS — H6123 Impacted cerumen, bilateral: Secondary | ICD-10-CM | POA: Diagnosis not present

## 2018-02-24 DIAGNOSIS — L718 Other rosacea: Secondary | ICD-10-CM | POA: Diagnosis not present

## 2018-02-24 DIAGNOSIS — C4442 Squamous cell carcinoma of skin of scalp and neck: Secondary | ICD-10-CM | POA: Diagnosis not present

## 2018-02-24 DIAGNOSIS — L821 Other seborrheic keratosis: Secondary | ICD-10-CM | POA: Diagnosis not present

## 2018-02-24 DIAGNOSIS — L570 Actinic keratosis: Secondary | ICD-10-CM | POA: Diagnosis not present

## 2018-02-24 DIAGNOSIS — D1801 Hemangioma of skin and subcutaneous tissue: Secondary | ICD-10-CM | POA: Diagnosis not present

## 2018-02-24 DIAGNOSIS — D225 Melanocytic nevi of trunk: Secondary | ICD-10-CM | POA: Diagnosis not present

## 2018-02-24 DIAGNOSIS — D485 Neoplasm of uncertain behavior of skin: Secondary | ICD-10-CM | POA: Diagnosis not present

## 2018-02-24 DIAGNOSIS — L814 Other melanin hyperpigmentation: Secondary | ICD-10-CM | POA: Diagnosis not present

## 2018-03-03 DIAGNOSIS — H2181 Floppy iris syndrome: Secondary | ICD-10-CM | POA: Diagnosis not present

## 2018-03-03 DIAGNOSIS — H0289 Other specified disorders of eyelid: Secondary | ICD-10-CM | POA: Diagnosis not present

## 2018-03-03 DIAGNOSIS — T1502XA Foreign body in cornea, left eye, initial encounter: Secondary | ICD-10-CM | POA: Diagnosis not present

## 2018-03-03 DIAGNOSIS — H1859 Other hereditary corneal dystrophies: Secondary | ICD-10-CM | POA: Diagnosis not present

## 2018-03-13 DIAGNOSIS — D044 Carcinoma in situ of skin of scalp and neck: Secondary | ICD-10-CM | POA: Diagnosis not present

## 2018-04-07 DIAGNOSIS — D044 Carcinoma in situ of skin of scalp and neck: Secondary | ICD-10-CM | POA: Diagnosis not present

## 2018-04-07 DIAGNOSIS — R634 Abnormal weight loss: Secondary | ICD-10-CM | POA: Diagnosis not present

## 2018-04-07 DIAGNOSIS — R1010 Upper abdominal pain, unspecified: Secondary | ICD-10-CM | POA: Diagnosis not present

## 2018-04-07 DIAGNOSIS — I10 Essential (primary) hypertension: Secondary | ICD-10-CM | POA: Diagnosis not present

## 2018-04-07 DIAGNOSIS — R5383 Other fatigue: Secondary | ICD-10-CM | POA: Diagnosis not present

## 2018-04-13 DIAGNOSIS — Z23 Encounter for immunization: Secondary | ICD-10-CM | POA: Diagnosis not present

## 2018-05-14 DIAGNOSIS — H1850 Unspecified hereditary corneal dystrophies: Secondary | ICD-10-CM | POA: Diagnosis not present

## 2018-05-14 DIAGNOSIS — Z961 Presence of intraocular lens: Secondary | ICD-10-CM | POA: Diagnosis not present

## 2018-05-14 DIAGNOSIS — H16421 Pannus (corneal), right eye: Secondary | ICD-10-CM | POA: Diagnosis not present

## 2018-05-14 DIAGNOSIS — H1851 Endothelial corneal dystrophy: Secondary | ICD-10-CM | POA: Diagnosis not present

## 2018-05-14 DIAGNOSIS — H1859 Other hereditary corneal dystrophies: Secondary | ICD-10-CM | POA: Diagnosis not present

## 2018-05-14 DIAGNOSIS — H18321 Folds in Descemet's membrane, right eye: Secondary | ICD-10-CM | POA: Diagnosis not present

## 2018-05-20 DIAGNOSIS — H04123 Dry eye syndrome of bilateral lacrimal glands: Secondary | ICD-10-CM | POA: Diagnosis not present

## 2018-05-20 DIAGNOSIS — H1811 Bullous keratopathy, right eye: Secondary | ICD-10-CM | POA: Diagnosis not present

## 2018-05-20 DIAGNOSIS — H52211 Irregular astigmatism, right eye: Secondary | ICD-10-CM | POA: Diagnosis not present

## 2018-05-20 DIAGNOSIS — Z961 Presence of intraocular lens: Secondary | ICD-10-CM | POA: Diagnosis not present

## 2018-06-02 DIAGNOSIS — D044 Carcinoma in situ of skin of scalp and neck: Secondary | ICD-10-CM | POA: Diagnosis not present

## 2018-06-02 DIAGNOSIS — L570 Actinic keratosis: Secondary | ICD-10-CM | POA: Diagnosis not present

## 2018-06-08 DIAGNOSIS — I10 Essential (primary) hypertension: Secondary | ICD-10-CM | POA: Diagnosis not present

## 2018-06-26 DIAGNOSIS — M1711 Unilateral primary osteoarthritis, right knee: Secondary | ICD-10-CM | POA: Diagnosis not present

## 2018-06-26 DIAGNOSIS — M25562 Pain in left knee: Secondary | ICD-10-CM | POA: Diagnosis not present

## 2018-06-26 DIAGNOSIS — M25561 Pain in right knee: Secondary | ICD-10-CM | POA: Diagnosis not present

## 2018-06-26 DIAGNOSIS — M1712 Unilateral primary osteoarthritis, left knee: Secondary | ICD-10-CM | POA: Diagnosis not present

## 2018-07-01 DIAGNOSIS — H04123 Dry eye syndrome of bilateral lacrimal glands: Secondary | ICD-10-CM | POA: Diagnosis not present

## 2018-07-01 DIAGNOSIS — H1851 Endothelial corneal dystrophy: Secondary | ICD-10-CM | POA: Diagnosis not present

## 2018-07-01 DIAGNOSIS — Z961 Presence of intraocular lens: Secondary | ICD-10-CM | POA: Diagnosis not present

## 2018-07-01 DIAGNOSIS — H1811 Bullous keratopathy, right eye: Secondary | ICD-10-CM | POA: Diagnosis not present

## 2018-07-19 ENCOUNTER — Encounter: Payer: Self-pay | Admitting: Gastroenterology

## 2018-07-31 DIAGNOSIS — Z1389 Encounter for screening for other disorder: Secondary | ICD-10-CM | POA: Diagnosis not present

## 2018-07-31 DIAGNOSIS — E7800 Pure hypercholesterolemia, unspecified: Secondary | ICD-10-CM | POA: Diagnosis not present

## 2018-07-31 DIAGNOSIS — Z79899 Other long term (current) drug therapy: Secondary | ICD-10-CM | POA: Diagnosis not present

## 2018-07-31 DIAGNOSIS — Z0000 Encounter for general adult medical examination without abnormal findings: Secondary | ICD-10-CM | POA: Diagnosis not present

## 2018-07-31 DIAGNOSIS — I10 Essential (primary) hypertension: Secondary | ICD-10-CM | POA: Diagnosis not present

## 2018-07-31 DIAGNOSIS — Z23 Encounter for immunization: Secondary | ICD-10-CM | POA: Diagnosis not present

## 2018-09-02 DIAGNOSIS — H52211 Irregular astigmatism, right eye: Secondary | ICD-10-CM | POA: Diagnosis not present

## 2018-09-02 DIAGNOSIS — H1851 Endothelial corneal dystrophy: Secondary | ICD-10-CM | POA: Diagnosis not present

## 2018-09-02 DIAGNOSIS — H04123 Dry eye syndrome of bilateral lacrimal glands: Secondary | ICD-10-CM | POA: Diagnosis not present

## 2018-09-02 DIAGNOSIS — Z961 Presence of intraocular lens: Secondary | ICD-10-CM | POA: Diagnosis not present

## 2018-09-02 DIAGNOSIS — H1811 Bullous keratopathy, right eye: Secondary | ICD-10-CM | POA: Diagnosis not present

## 2018-09-03 DIAGNOSIS — D225 Melanocytic nevi of trunk: Secondary | ICD-10-CM | POA: Diagnosis not present

## 2018-09-03 DIAGNOSIS — L570 Actinic keratosis: Secondary | ICD-10-CM | POA: Diagnosis not present

## 2018-09-03 DIAGNOSIS — Z85828 Personal history of other malignant neoplasm of skin: Secondary | ICD-10-CM | POA: Diagnosis not present

## 2018-09-03 DIAGNOSIS — L820 Inflamed seborrheic keratosis: Secondary | ICD-10-CM | POA: Diagnosis not present

## 2018-09-03 DIAGNOSIS — L821 Other seborrheic keratosis: Secondary | ICD-10-CM | POA: Diagnosis not present

## 2018-12-18 DIAGNOSIS — M1712 Unilateral primary osteoarthritis, left knee: Secondary | ICD-10-CM | POA: Diagnosis not present

## 2018-12-18 DIAGNOSIS — M170 Bilateral primary osteoarthritis of knee: Secondary | ICD-10-CM | POA: Diagnosis not present

## 2019-01-04 DIAGNOSIS — M1712 Unilateral primary osteoarthritis, left knee: Secondary | ICD-10-CM | POA: Diagnosis not present

## 2019-01-04 DIAGNOSIS — M25562 Pain in left knee: Secondary | ICD-10-CM | POA: Diagnosis not present

## 2019-01-06 DIAGNOSIS — H52211 Irregular astigmatism, right eye: Secondary | ICD-10-CM | POA: Diagnosis not present

## 2019-01-06 DIAGNOSIS — H04123 Dry eye syndrome of bilateral lacrimal glands: Secondary | ICD-10-CM | POA: Diagnosis not present

## 2019-01-06 DIAGNOSIS — H1811 Bullous keratopathy, right eye: Secondary | ICD-10-CM | POA: Diagnosis not present

## 2019-01-07 DIAGNOSIS — L570 Actinic keratosis: Secondary | ICD-10-CM | POA: Diagnosis not present

## 2019-01-07 DIAGNOSIS — L814 Other melanin hyperpigmentation: Secondary | ICD-10-CM | POA: Diagnosis not present

## 2019-01-07 DIAGNOSIS — L821 Other seborrheic keratosis: Secondary | ICD-10-CM | POA: Diagnosis not present

## 2019-01-07 DIAGNOSIS — Z85828 Personal history of other malignant neoplasm of skin: Secondary | ICD-10-CM | POA: Diagnosis not present

## 2019-01-07 DIAGNOSIS — C44529 Squamous cell carcinoma of skin of other part of trunk: Secondary | ICD-10-CM | POA: Diagnosis not present

## 2019-01-07 DIAGNOSIS — D225 Melanocytic nevi of trunk: Secondary | ICD-10-CM | POA: Diagnosis not present

## 2019-01-19 DIAGNOSIS — C4442 Squamous cell carcinoma of skin of scalp and neck: Secondary | ICD-10-CM | POA: Diagnosis not present

## 2019-01-27 DIAGNOSIS — I10 Essential (primary) hypertension: Secondary | ICD-10-CM | POA: Diagnosis not present

## 2019-01-27 DIAGNOSIS — Z79899 Other long term (current) drug therapy: Secondary | ICD-10-CM | POA: Diagnosis not present

## 2019-01-27 DIAGNOSIS — I872 Venous insufficiency (chronic) (peripheral): Secondary | ICD-10-CM | POA: Diagnosis not present

## 2019-01-27 DIAGNOSIS — K9289 Other specified diseases of the digestive system: Secondary | ICD-10-CM | POA: Diagnosis not present

## 2019-02-17 DIAGNOSIS — L718 Other rosacea: Secondary | ICD-10-CM | POA: Diagnosis not present

## 2019-02-17 DIAGNOSIS — L570 Actinic keratosis: Secondary | ICD-10-CM | POA: Diagnosis not present

## 2019-02-17 DIAGNOSIS — L820 Inflamed seborrheic keratosis: Secondary | ICD-10-CM | POA: Diagnosis not present

## 2019-02-17 DIAGNOSIS — L905 Scar conditions and fibrosis of skin: Secondary | ICD-10-CM | POA: Diagnosis not present

## 2019-03-01 DIAGNOSIS — M25662 Stiffness of left knee, not elsewhere classified: Secondary | ICD-10-CM | POA: Diagnosis not present

## 2019-03-01 DIAGNOSIS — M25562 Pain in left knee: Secondary | ICD-10-CM | POA: Diagnosis not present

## 2019-03-01 DIAGNOSIS — M1712 Unilateral primary osteoarthritis, left knee: Secondary | ICD-10-CM | POA: Diagnosis not present

## 2019-03-17 NOTE — H&P (Signed)
TOTAL KNEE ADMISSION H&P  Patient is being admitted for left total knee arthroplasty.  Subjective:  Chief Complaint:  Left knee primary OA / pain  HPI: Michelle Mosley, 85 y.o. female, has a history of pain and functional disability in the left knee due to arthritis and has failed non-surgical conservative treatments for greater than 12 weeks to include NSAID's and/or analgesics, corticosteriod injections and activity modification.  Onset of symptoms was gradual, starting >10 years ago with gradually worsening course since that time. The patient noted no past surgery on the left knee(s).  Patient currently rates pain in the left knee(s) at 7 out of 10 with activity. Patient has night pain, worsening of pain with activity and weight bearing, pain that interferes with activities of daily living, pain with passive range of motion, crepitus and joint swelling.  Patient has evidence of periarticular osteophytes and joint space narrowing by imaging studies. There is no active infection.  Risks, benefits and expectations were discussed with the patient.  Risks including but not limited to the risk of anesthesia, blood clots, nerve damage, blood vessel damage, failure of the prosthesis, infection and up to and including death.  Patient understand the risks, benefits and expectations and wishes to proceed with surgery.   PCP: Briscoe Deutscher, MD  D/C Plans:       Home   Post-op Meds:       No Rx given   Tranexamic Acid:      To be given - IV   Decadron:      Is to be given  FYI:      ASA  Norco  No NSAIDs  DME:   Pt already has equipment  PT:   OPPT  - Rx given   Past Medical History:  Diagnosis Date  . Hypertension     Past Surgical History:  Procedure Laterality Date  . broken neck  1970's  . [place]  . excision of breast cyst Left     No current facility-administered medications for this encounter.    Current Outpatient Medications  Medication Sig Dispense Refill  Last Dose  . AMLODIPINE BESYLATE PO Take 5 mg by mouth daily.   Taking  . aspirin 325 MG tablet Take 325 mg by mouth daily.   Taking  . carvedilol (COREG CR) 20 MG 24 hr capsule Take 20 mg by mouth daily.   Taking  . Cholecalciferol (VITAMIN D) 2000 units CAPS Take by mouth daily.   Taking  . Multiple Vitamin (MULTIVITAMIN) tablet Take 1 tablet by mouth daily.   Taking  . sertraline (ZOLOFT) 50 MG tablet Take 50 mg by mouth daily.   Not Taking  . valsartan-hydrochlorothiazide (DIOVAN-HCT) 320-12.5 MG per tablet Take 1 tablet by mouth daily.   Taking   No Known Allergies   Social History   Tobacco Use  . Smoking status: Never Smoker  . Smokeless tobacco: Never Used  Substance Use Topics  . Alcohol use: No    Family History  Problem Relation Age of Onset  . Colon cancer Mother      Review of Systems  Constitutional: Negative.   HENT: Negative.   Eyes: Negative.   Respiratory: Negative.   Cardiovascular: Negative.   Gastrointestinal: Positive for constipation.  Genitourinary: Negative.   Musculoskeletal: Positive for joint pain.  Skin: Negative.   Neurological: Negative.   Endo/Heme/Allergies: Negative.   Psychiatric/Behavioral: Negative.     Objective:  Physical Exam  Constitutional: She is oriented  to person, place, and time. She appears well-developed.  HENT:  Head: Normocephalic.  Eyes: Pupils are equal, round, and reactive to light.  Neck: Neck supple. No JVD present. No tracheal deviation present. No thyromegaly present.  Cardiovascular: Normal rate, regular rhythm and intact distal pulses.  Respiratory: Effort normal and breath sounds normal. No respiratory distress. She has no wheezes.  GI: Soft. There is no abdominal tenderness. There is no guarding.  Musculoskeletal:     Left knee: She exhibits decreased range of motion, swelling and bony tenderness. She exhibits no ecchymosis, no deformity, no laceration and no erythema. Tenderness found.  Lymphadenopathy:     She has no cervical adenopathy.  Neurological: She is alert and oriented to person, place, and time.  Skin: Skin is warm and dry.  Psychiatric: She has a normal mood and affect.     Labs:  Estimated body mass index is 27.36 kg/m as calculated from the following:   Height as of 08/20/16: 5' 6.5" (1.689 m).   Weight as of 08/20/16: 78.1 kg.   Imaging Review Plain radiographs demonstrate severe degenerative joint disease of the left knee. The overall alignment issignificant valgus. The bone quality appears to be good for age and reported activity level.      Assessment/Plan:  End stage arthritis, left knee   The patient history, physical examination, clinical judgment of the provider and imaging studies are consistent with end stage degenerative joint disease of the left knee and total knee arthroplasty is deemed medically necessary. The treatment options including medical management, injection therapy arthroscopy and arthroplasty were discussed at length. The risks and benefits of total knee arthroplasty were presented and reviewed. The risks due to aseptic loosening, infection, stiffness, patella tracking problems, thromboembolic complications and other imponderables were discussed. The patient acknowledged the explanation, agreed to proceed with the plan and consent was signed. Patient is being admitted for inpatient treatment for surgery, pain control, PT, OT, prophylactic antibiotics, VTE prophylaxis, progressive ambulation and ADL's and discharge planning. The patient is planning to be discharged home.     Patient's anticipated LOS is less than 2 midnights, meeting these requirements: - Lives within 1 hour of care - Has a competent adult at home to recover with post-op recover - NO history of  - Chronic pain requiring opiods  - Diabetes  - Coronary Artery Disease  - Heart failure  - Heart attack  - Stroke  - DVT/VTE  - Cardiac arrhythmia  - Respiratory Failure/COPD  -  Renal failure  - Anemia  - Advanced Liver disease     [place] Pugh. Dail Lerew   PA-C  03/17/2019, 12:25 PM

## 2019-03-23 NOTE — Patient Instructions (Addendum)
DUE TO COVID-19 ONLY ONE VISITOR IS ALLOWED TO COME WITH YOU AND STAY IN THE WAITING ROOM ONLY DURING PRE OP AND PROCEDURE DAY OF SURGERY. THE 1 VISITOR MAY VISIT WITH YOU AFTER SURGERY IN YOUR PRIVATE ROOM DURING VISITING HOURS ONLY!  YOU NEED TO HAVE A COVID 19 TEST ON__Monday 09/14/2020_____ @___1050  am____, THIS TEST MUST BE DONE BEFORE SURGERY, COME  [place] , [id number].  ([place]) ONCE YOUR COVID TEST IS COMPLETED, PLEASE BEGIN THE QUARANTINE INSTRUCTIONS AS OUTLINED IN YOUR HANDOUT.                JYNESIS MALTESE  03/23/2019   Your procedure is scheduled on: Thursday 04/01/2019   Report to [facility] Main  Entrance              Report to Short Stay at   [place] AM     Call this number if you have problems the morning of surgery [phone]    Remember: Do not eat food  :After Midnight.              NO SOLID FOOD AFTER MIDNIGHT THE NIGHT PRIOR TO SURGERY. NOTHING BY MOUTH EXCEPT CLEAR LIQUIDS UNTIL  0415 am .              PLEASE FINISH ENSURE DRINK PER SURGEON ORDER  WHICH NEEDS TO BE COMPLETED AT  0415 am .   CLEAR LIQUID DIET   Foods Allowed                                                                     Foods Excluded  Coffee and tea, regular and decaf                             liquids that you cannot  Plain Jell-O any favor except red or purple                                           see through such as: Fruit ices (not with fruit pulp)                                     milk, soups, orange juice  Iced Popsicles                                    All solid food Carbonated beverages, regular and diet                                    Cranberry, grape and apple juices Sports drinks like Gatorade Lightly seasoned clear broth or consume(fat free) Sugar, honey syrup  Sample Menu Breakfast  Lunch                                     Supper Cranberry juice                    Beef broth                             Chicken broth Jell-O                                     Grape juice                           Apple juice Coffee or tea                        Jell-O                                      Popsicle                                                Coffee or tea                        Coffee or tea  _____________________________________________________________________               BRUSH YOUR TEETH MORNING OF SURGERY AND RINSE YOUR MOUTH OUT, NO CHEWING GUM CANDY OR MINTS.     Take these medicines the morning of surgery with A SIP OF WATER: Amlodipine (Norvasc), Carvedilol (Coreg)                                 You may not have any metal on your body including hair pins and              piercings  Do not wear jewelry, make-up, lotions, powders or perfumes, deodorant             Do not wear nail polish.  Do not shave  48 hours prior to surgery.               Do not bring valuables to the hospital. [place].  Contacts, dentures or bridgework may not be worn into surgery.  Leave suitcase in the car. After surgery it may be brought to your room.                  Please read over the following fact sheets you were given: _____________________________________________________________________ [facility] - Preparing for Surgery Before surgery, you can play an important role.  Because skin is not sterile, your skin needs to be as free of germs as possible.  You can reduce the number of germs on your skin by washing with CHG (chlorahexidine gluconate) soap before surgery.  CHG is an  antiseptic cleaner which kills germs and bonds with the skin to continue killing germs even after washing. Please DO NOT use if you have an allergy to CHG or antibacterial soaps.  If your skin becomes reddened/irritated stop using the CHG and inform your nurse when you arrive at Short Stay. Do not shave (including legs and underarms) for at least  48 hours prior to the first CHG shower.  You may shave your face/neck. Please follow these instructions carefully:  1.  Shower with CHG Soap the night before surgery and the  morning of Surgery.  2.  If you choose to wash your hair, wash your hair first as usual with your  normal  shampoo.  3.  After you shampoo, rinse your hair and body thoroughly to remove the  shampoo.                           4.  Use CHG as you would any other liquid soap.  You can apply chg directly  to the skin and wash                       Gently with a scrungie or clean washcloth.  5.  Apply the CHG Soap to your body ONLY FROM THE NECK DOWN.   Do not use on face/ open                           Wound or open sores. Avoid contact with eyes, ears mouth and genitals (private parts).                       Wash face,  Genitals (private parts) with your normal soap.             6.  Wash thoroughly, paying special attention to the area where your surgery  will be performed.  7.  Thoroughly rinse your body with warm water from the neck down.  8.  DO NOT shower/wash with your normal soap after using and rinsing off  the CHG Soap.                9.  Pat yourself dry with a clean towel.            10.  Wear clean pajamas.            11.  Place clean sheets on your bed the night of your first shower and do not  sleep with pets. Day of Surgery : Do not apply any lotions/deodorants the morning of surgery.  Please wear clean clothes to the hospital/surgery center.  FAILURE TO FOLLOW THESE INSTRUCTIONS MAY RESULT IN THE CANCELLATION OF YOUR SURGERY PATIENT SIGNATURE_________________________________  NURSE SIGNATURE__________________________________  ________________________________________________________________________               Adam Phenix  An incentive spirometer is a tool that can help keep your lungs clear and active. This tool measures how well you are filling your lungs with each breath. Taking long deep  breaths may help reverse or decrease the chance of developing breathing (pulmonary) problems (especially infection) following:  A long period of time when you are unable to move or be active. BEFORE THE PROCEDURE   If the spirometer includes an indicator to show your best effort, your nurse or respiratory therapist will set it to a desired goal.  If possible, sit up  straight or lean slightly forward. Try not to slouch.  Hold the incentive spirometer in an upright position. INSTRUCTIONS FOR USE  1. Sit on the edge of your bed if possible, or sit up as far as you can in bed or on a chair. 2. Hold the incentive spirometer in an upright position. 3. Breathe out normally. 4. Place the mouthpiece in your mouth and seal your lips tightly around it. 5. Breathe in slowly and as deeply as possible, raising the piston or the ball toward the top of the column. 6. Hold your breath for 3-5 seconds or for as long as possible. Allow the piston or ball to fall to the bottom of the column. 7. Remove the mouthpiece from your mouth and breathe out normally. 8. Rest for a few seconds and repeat Steps 1 through 7 at least 10 times every 1-2 hours when you are awake. Take your time and take a few normal breaths between deep breaths. 9. The spirometer may include an indicator to show your best effort. Use the indicator as a goal to work toward during each repetition. 10. After each set of 10 deep breaths, practice coughing to be sure your lungs are clear. If you have an incision (the cut made at the time of surgery), support your incision when coughing by placing a pillow or rolled up towels firmly against it. Once you are able to get out of bed, walk around indoors and cough well. You may stop using the incentive spirometer when instructed by your caregiver.  RISKS AND COMPLICATIONS  Take your time so you do not get dizzy or light-headed.  If you are in pain, you may need to take or ask for pain medication before  doing incentive spirometry. It is harder to take a deep breath if you are having pain. AFTER USE  Rest and breathe slowly and easily.  It can be helpful to keep track of a log of your progress. Your caregiver can provide you with a simple table to help with this. If you are using the spirometer at home, follow these instructions: [place] IF:   You are having difficultly using the spirometer.  You have trouble using the spirometer as often as instructed.  Your pain medication is not giving enough relief while using the spirometer.  You develop fever of 100.5 F (38.1 C) or higher. SEEK IMMEDIATE MEDICAL CARE IF:   You cough up bloody sputum that had not been present before.  You develop fever of 102 F (38.9 C) or greater.  You develop worsening pain at or near the incision site. MAKE SURE YOU:   Understand these instructions.  Will watch your condition.  Will get help right away if you are not doing well or get worse. Document Released: 11/11/2006 Document Revised: 09/23/2011 Document Reviewed: 01/12/2007 ExitCare Patient Information 2014 ExitCare, [place].   ________________________________________________________________________  WHAT IS A BLOOD TRANSFUSION? Blood Transfusion Information  A transfusion is the replacement of blood or some of its parts. Blood is made up of multiple cells which provide different functions.  Red blood cells carry oxygen and are used for blood loss replacement.  White blood cells fight against infection.  Platelets control bleeding.  Plasma helps clot blood.  Other blood products are available for specialized needs, such as hemophilia or other clotting disorders. BEFORE THE TRANSFUSION  Who gives blood for transfusions?   Healthy volunteers who are fully evaluated to make sure their blood is safe. This is blood bank  blood. Transfusion therapy is the safest it has ever been in the practice of medicine. Before blood is taken  from a donor, a complete history is taken to make sure that person has no history of diseases nor engages in risky social behavior (examples are intravenous drug use or sexual activity with multiple partners). The donor's travel history is screened to minimize risk of transmitting infections, such as malaria. The donated blood is tested for signs of infectious diseases, such as HIV and hepatitis. The blood is then tested to be sure it is compatible with you in order to minimize the chance of a transfusion reaction. If you or a relative donates blood, this is often done in anticipation of surgery and is not appropriate for emergency situations. It takes many days to process the donated blood. RISKS AND COMPLICATIONS Although transfusion therapy is very safe and saves many lives, the main dangers of transfusion include:   Getting an infectious disease.  Developing a transfusion reaction. This is an allergic reaction to something in the blood you were given. Every precaution is taken to prevent this. The decision to have a blood transfusion has been considered carefully by your caregiver before blood is given. Blood is not given unless the benefits outweigh the risks. AFTER THE TRANSFUSION  Right after receiving a blood transfusion, you will usually feel much better and more energetic. This is especially true if your red blood cells have gotten low (anemic). The transfusion raises the level of the red blood cells which carry oxygen, and this usually causes an energy increase.  The nurse administering the transfusion will monitor you carefully for complications. HOME CARE INSTRUCTIONS  No special instructions are needed after a transfusion. You may find your energy is better. Speak with your caregiver about any limitations on activity for underlying diseases you may have. SEEK MEDICAL CARE IF:   Your condition is not improving after your transfusion.  You develop redness or irritation at the  intravenous (IV) site. SEEK IMMEDIATE MEDICAL CARE IF:  Any of the following symptoms occur over the next 12 hours:  Shaking chills.  You have a temperature by mouth above 102 F (38.9 C), not controlled by medicine.  Chest, back, or muscle pain.  People around you feel you are not acting correctly or are confused.  Shortness of breath or difficulty breathing.  Dizziness and fainting.  You get a rash or develop hives.  You have a decrease in urine output.  Your urine turns a dark color or changes to pink, red, or brown. Any of the following symptoms occur over the next 10 days:  You have a temperature by mouth above 102 F (38.9 C), not controlled by medicine.  Shortness of breath.  Weakness after normal activity.  The white part of the eye turns yellow (jaundice).  You have a decrease in the amount of urine or are urinating less often.  Your urine turns a dark color or changes to pink, red, or brown. Document Released: 06/28/2000 Document Revised: 09/23/2011 Document Reviewed: 02/15/2008 [facility] Patient Information 2014 [place], [place].  _______________________________________________________________________

## 2019-03-24 ENCOUNTER — Encounter (HOSPITAL_COMMUNITY)
Admission: RE | Admit: 2019-03-24 | Discharge: 2019-03-24 | Disposition: A | Discharge status: Home / Self Care | Payer: Medicare Other | Source: Ambulatory Visit | Attending: Orthopedic Surgery | Admitting: Orthopedic Surgery

## 2019-03-24 ENCOUNTER — Encounter (HOSPITAL_COMMUNITY): Payer: Self-pay

## 2019-03-24 ENCOUNTER — Other Ambulatory Visit: Payer: Self-pay

## 2019-03-24 DIAGNOSIS — Z01812 Encounter for preprocedural laboratory examination: Secondary | ICD-10-CM | POA: Insufficient documentation

## 2019-03-24 DIAGNOSIS — I10 Essential (primary) hypertension: Secondary | ICD-10-CM | POA: Diagnosis not present

## 2019-03-24 DIAGNOSIS — Z0184 Encounter for antibody response examination: Secondary | ICD-10-CM | POA: Insufficient documentation

## 2019-03-24 DIAGNOSIS — R9431 Abnormal electrocardiogram [ECG] [EKG]: Secondary | ICD-10-CM | POA: Insufficient documentation

## 2019-03-24 HISTORY — DX: Unspecified osteoarthritis, unspecified site: M19.90

## 2019-03-24 LAB — BASIC METABOLIC PANEL
Anion gap: 8 (ref 5–15)
BUN: 11 mg/dL (ref 8–23)
CO2: 29 mmol/L (ref 22–32)
Calcium: 9.3 mg/dL (ref 8.9–10.3)
Chloride: 96 mmol/L — ABNORMAL LOW (ref 98–111)
Creatinine, Ser: 0.6 mg/dL (ref 0.44–1.00)
GFR calc Af Amer: >60 mL/min (ref >60)
GFR calc non Af Amer: >60 mL/min (ref >60)
Glucose, Bld: 107 mg/dL — ABNORMAL HIGH (ref 70–99)
Potassium: 5.4 mmol/L — ABNORMAL HIGH (ref 3.5–5.1)
Sodium: 133 mmol/L — ABNORMAL LOW (ref 135–145)

## 2019-03-24 LAB — CBC
HCT: 39.6 % (ref 36.0–46.0)
Hemoglobin: 12.6 g/dL (ref 12.0–15.0)
MCH: 31.1 pg (ref 26.0–34.0)
MCHC: 31.8 g/dL (ref 30.0–36.0)
MCV: 97.8 fL (ref 80.0–100.0)
Platelets: 216 10*3/uL (ref 150–400)
RBC: 4.05 MIL/uL (ref 3.87–5.11)
RDW: 13.2 % (ref 11.5–15.5)
WBC: 6 10*3/uL (ref 4.0–10.5)
nRBC: 0 % (ref 0.0–0.2)

## 2019-03-24 LAB — ABO/RH: ABO/RH(D): A POS

## 2019-03-24 LAB — SURGICAL PCR SCREEN
MRSA, PCR: NEGATIVE
Staphylococcus aureus: NEGATIVE

## 2019-03-24 NOTE — Progress Notes (Addendum)
PCP - Dr. Lajean Manes  LOV 06/08/2018 Cardiologist - N/A  Chest x-ray - N/A EKG - 03/24/2019 Stress Test - N/A ECHO - N/A Cardiac Cath - N/A  Sleep Study - N/A CPAP -   Fasting Blood Sugar - N/A Checks Blood Sugar _____ times a day  Blood Thinner Instructions: Aspirin Instructions:N/A Last Dose:  Anesthesia review:   Chart given to Iver Nestle, PA for review .  Patient denies shortness of breath, fever, cough and chest pain at PAT appointment   Patient verbalized understanding of instructions that were given to them at the PAT appointment. Patient was also instructed that they will need to review over the PAT instructions again at home before surgery.

## 2019-03-29 ENCOUNTER — Other Ambulatory Visit (HOSPITAL_COMMUNITY)
Admission: RE | Admit: 2019-03-29 | Discharge: 2019-03-29 | Disposition: A | Discharge status: Home / Self Care | Payer: Medicare Other | Source: Ambulatory Visit | Attending: Orthopedic Surgery | Admitting: Orthopedic Surgery

## 2019-03-29 DIAGNOSIS — Z01812 Encounter for preprocedural laboratory examination: Secondary | ICD-10-CM | POA: Diagnosis not present

## 2019-03-29 DIAGNOSIS — Z20828 Contact with and (suspected) exposure to other viral communicable diseases: Secondary | ICD-10-CM | POA: Insufficient documentation

## 2019-03-30 LAB — NOVEL CORONAVIRUS, NAA (HOSP ORDER, SEND-OUT TO REF LAB; TAT 18-24 HRS): SARS-CoV-2, NAA: NOT DETECTED

## 2019-04-01 ENCOUNTER — Encounter (HOSPITAL_COMMUNITY): Payer: Self-pay | Admitting: Anesthesiology

## 2019-04-01 ENCOUNTER — Ambulatory Visit (HOSPITAL_COMMUNITY): Payer: Medicare Other | Admitting: Anesthesiology

## 2019-04-01 ENCOUNTER — Observation Stay (HOSPITAL_COMMUNITY)
Admission: RE | Admit: 2019-04-01 | Discharge: 2019-04-02 | Disposition: A | Discharge status: Home / Self Care | Payer: Medicare Other | Source: Other Acute Inpatient Hospital | Attending: Orthopedic Surgery | Admitting: Orthopedic Surgery

## 2019-04-01 ENCOUNTER — Ambulatory Visit (HOSPITAL_COMMUNITY): Payer: Medicare Other | Admitting: Physician Assistant

## 2019-04-01 ENCOUNTER — Other Ambulatory Visit: Payer: Self-pay

## 2019-04-01 ENCOUNTER — Encounter (HOSPITAL_COMMUNITY)
Admission: RE | Disposition: A | Discharge status: Home / Self Care | Payer: Self-pay | Source: Other Acute Inpatient Hospital | Attending: Orthopedic Surgery

## 2019-04-01 DIAGNOSIS — Z7982 Long term (current) use of aspirin: Secondary | ICD-10-CM | POA: Insufficient documentation

## 2019-04-01 DIAGNOSIS — Z96652 Presence of left artificial knee joint: Secondary | ICD-10-CM

## 2019-04-01 DIAGNOSIS — Z6827 Body mass index (BMI) 27.0-27.9, adult: Secondary | ICD-10-CM | POA: Diagnosis not present

## 2019-04-01 DIAGNOSIS — M1712 Unilateral primary osteoarthritis, left knee: Secondary | ICD-10-CM | POA: Diagnosis not present

## 2019-04-01 DIAGNOSIS — I10 Essential (primary) hypertension: Secondary | ICD-10-CM | POA: Diagnosis not present

## 2019-04-01 DIAGNOSIS — E663 Overweight: Secondary | ICD-10-CM | POA: Diagnosis not present

## 2019-04-01 DIAGNOSIS — Z86718 Personal history of other venous thrombosis and embolism: Secondary | ICD-10-CM | POA: Insufficient documentation

## 2019-04-01 DIAGNOSIS — Z79899 Other long term (current) drug therapy: Secondary | ICD-10-CM | POA: Diagnosis not present

## 2019-04-01 DIAGNOSIS — G8918 Other acute postprocedural pain: Secondary | ICD-10-CM | POA: Diagnosis not present

## 2019-04-01 HISTORY — PX: TOTAL KNEE ARTHROPLASTY: SHX125

## 2019-04-01 LAB — BASIC METABOLIC PANEL
Anion gap: 10 (ref 5–15)
BUN: 14 mg/dL (ref 8–23)
CO2: 26 mmol/L (ref 22–32)
Calcium: 9 mg/dL (ref 8.9–10.3)
Chloride: 98 mmol/L (ref 98–111)
Creatinine, Ser: 0.66 mg/dL (ref 0.44–1.00)
GFR calc Af Amer: >60 mL/min (ref >60)
GFR calc non Af Amer: >60 mL/min (ref >60)
Glucose, Bld: 108 mg/dL — ABNORMAL HIGH (ref 70–99)
Potassium: 3.5 mmol/L (ref 3.5–5.1)
Sodium: 134 mmol/L — ABNORMAL LOW (ref 135–145)

## 2019-04-01 SURGERY — ARTHROPLASTY, KNEE, TOTAL
Anesthesia: Spinal | Site: Knee | Laterality: Left

## 2019-04-01 MED ORDER — BUPIVACAINE-EPINEPHRINE (PF) 0.5% -1:200000 IJ SOLN
INTRAMUSCULAR | Status: DC | PRN
  Administered 2019-04-01: 30 mL via PERINEURAL

## 2019-04-01 MED ORDER — CEFAZOLIN SODIUM-DEXTROSE 2-4 GM/100ML-% IV SOLN
2.0000 g | INTRAVENOUS | Status: AC
  Administered 2019-04-01: 2 g via INTRAVENOUS
  Filled 2019-04-01: qty 100

## 2019-04-01 MED ORDER — HYDROCODONE-ACETAMINOPHEN 5-325 MG PO TABS
1.0000 | ORAL_TABLET | ORAL | Status: DC | PRN
  Administered 2019-04-01 (×2): 1 via ORAL
  Administered 2019-04-02 (×2): 2 via ORAL
  Filled 2019-04-01 (×2): qty 1
  Filled 2019-04-01 (×2): qty 2

## 2019-04-01 MED ORDER — HYDROMORPHONE HCL 1 MG/ML IJ SOLN
0.5000 mg | INTRAMUSCULAR | Status: DC | PRN
  Filled 2019-04-01: qty 1

## 2019-04-01 MED ORDER — FENTANYL CITRATE (PF) 100 MCG/2ML IJ SOLN
INTRAMUSCULAR | Status: AC
  Filled 2019-04-01: qty 2

## 2019-04-01 MED ORDER — PROPOFOL 500 MG/50ML IV EMUL
INTRAVENOUS | Status: DC | PRN
  Administered 2019-04-01: 55 ug/kg/min via INTRAVENOUS

## 2019-04-01 MED ORDER — TRANEXAMIC ACID-NACL 1000-0.7 MG/100ML-% IV SOLN
1000.0000 mg | Freq: Once | INTRAVENOUS | Status: AC
  Administered 2019-04-01: 14:00:00 1000 mg via INTRAVENOUS
  Filled 2019-04-01: qty 100

## 2019-04-01 MED ORDER — MAGNESIUM CITRATE PO SOLN: 1.0000 | Freq: Once | ORAL | Status: DC | PRN

## 2019-04-01 MED ORDER — DEXAMETHASONE SODIUM PHOSPHATE 10 MG/ML IJ SOLN
INTRAMUSCULAR | Status: DC | PRN
  Administered 2019-04-01: 6 mg via INTRAVENOUS

## 2019-04-01 MED ORDER — KETOROLAC TROMETHAMINE 30 MG/ML IJ SOLN
INTRAMUSCULAR | Status: DC | PRN
  Administered 2019-04-01: 30 mg

## 2019-04-01 MED ORDER — DIPHENHYDRAMINE HCL 12.5 MG/5ML PO ELIX: 12.5000 mg | ORAL_SOLUTION | ORAL | Status: DC | PRN

## 2019-04-01 MED ORDER — IRBESARTAN 150 MG PO TABS
300.0000 mg | ORAL_TABLET | Freq: Every day | ORAL | Status: DC
  Filled 2019-04-01: qty 2

## 2019-04-01 MED ORDER — METOCLOPRAMIDE HCL 5 MG PO TABS: 5.0000 mg | ORAL_TABLET | Freq: Three times a day (TID) | ORAL | Status: DC | PRN

## 2019-04-01 MED ORDER — DEXAMETHASONE SODIUM PHOSPHATE 10 MG/ML IJ SOLN: 10.0000 mg | Freq: Once | INTRAMUSCULAR | Status: AC

## 2019-04-01 MED ORDER — ROCURONIUM BROMIDE 10 MG/ML (PF) SYRINGE
PREFILLED_SYRINGE | INTRAVENOUS | Status: AC
  Filled 2019-04-01: qty 10

## 2019-04-01 MED ORDER — ROPIVACAINE HCL 7.5 MG/ML IJ SOLN
INTRAMUSCULAR | Status: DC | PRN
  Administered 2019-04-01: 20 mL via PERINEURAL

## 2019-04-01 MED ORDER — METOCLOPRAMIDE HCL 5 MG/ML IJ SOLN: 5.0000 mg | Freq: Three times a day (TID) | INTRAMUSCULAR | Status: DC | PRN

## 2019-04-01 MED ORDER — BISACODYL 10 MG RE SUPP: 10.0000 mg | Freq: Every day | RECTAL | Status: DC | PRN

## 2019-04-01 MED ORDER — POLYETHYLENE GLYCOL 3350 17 G PO PACK
17.0000 g | PACK | Freq: Two times a day (BID) | ORAL | Status: DC
  Administered 2019-04-01 – 2019-04-02 (×2): 17 g via ORAL
  Filled 2019-04-01 (×2): qty 1

## 2019-04-01 MED ORDER — METHOCARBAMOL 500 MG IVPB - SIMPLE MED
500.0000 mg | Freq: Four times a day (QID) | INTRAVENOUS | Status: DC | PRN
  Filled 2019-04-01: qty 50

## 2019-04-01 MED ORDER — FENTANYL CITRATE (PF) 100 MCG/2ML IJ SOLN
INTRAMUSCULAR | Status: DC | PRN
  Administered 2019-04-01 (×2): 50 ug via INTRAVENOUS

## 2019-04-01 MED ORDER — ACETAMINOPHEN 325 MG PO TABS: 325.0000 mg | ORAL_TABLET | Freq: Four times a day (QID) | ORAL | Status: DC | PRN

## 2019-04-01 MED ORDER — DEXAMETHASONE SODIUM PHOSPHATE 10 MG/ML IJ SOLN
10.0000 mg | Freq: Once | INTRAMUSCULAR | Status: AC
  Administered 2019-04-02: 10 mg via INTRAVENOUS
  Filled 2019-04-01: qty 1

## 2019-04-01 MED ORDER — METHOCARBAMOL 500 MG PO TABS
500.0000 mg | ORAL_TABLET | Freq: Four times a day (QID) | ORAL | Status: DC | PRN
  Administered 2019-04-01 (×2): 500 mg via ORAL
  Filled 2019-04-01 (×2): qty 1

## 2019-04-01 MED ORDER — ONDANSETRON HCL 4 MG/2ML IJ SOLN: 4.0000 mg | Freq: Once | INTRAMUSCULAR | Status: DC | PRN

## 2019-04-01 MED ORDER — KETOROLAC TROMETHAMINE 30 MG/ML IJ SOLN
INTRAMUSCULAR | Status: AC
  Filled 2019-04-01: qty 1

## 2019-04-01 MED ORDER — LACTATED RINGERS IV SOLN
INTRAVENOUS | Status: DC
  Administered 2019-04-01 (×2): via INTRAVENOUS

## 2019-04-01 MED ORDER — FERROUS SULFATE 325 (65 FE) MG PO TABS
325.0000 mg | ORAL_TABLET | Freq: Two times a day (BID) | ORAL | Status: DC
  Administered 2019-04-01 – 2019-04-02 (×2): 325 mg via ORAL
  Filled 2019-04-01 (×2): qty 1

## 2019-04-01 MED ORDER — SODIUM CHLORIDE (PF) 0.9 % IJ SOLN
INTRAMUSCULAR | Status: DC | PRN
  Administered 2019-04-01: 30 mL

## 2019-04-01 MED ORDER — OXYCODONE HCL 5 MG/5ML PO SOLN: 5.0000 mg | Freq: Once | ORAL | Status: DC | PRN

## 2019-04-01 MED ORDER — ALUM & MAG HYDROXIDE-SIMETH 200-200-20 MG/5ML PO SUSP: 15.0000 mL | ORAL | Status: DC | PRN

## 2019-04-01 MED ORDER — TRANEXAMIC ACID-NACL 1000-0.7 MG/100ML-% IV SOLN
1000.0000 mg | INTRAVENOUS | Status: AC
  Administered 2019-04-01: 1000 mg via INTRAVENOUS
  Filled 2019-04-01: qty 100

## 2019-04-01 MED ORDER — CARVEDILOL 6.25 MG PO TABS
6.2500 mg | ORAL_TABLET | Freq: Two times a day (BID) | ORAL | Status: DC
  Administered 2019-04-01 – 2019-04-02 (×2): 6.25 mg via ORAL
  Filled 2019-04-01 (×2): qty 1

## 2019-04-01 MED ORDER — MENTHOL 3 MG MT LOZG: 1.0000 | LOZENGE | OROMUCOSAL | Status: DC | PRN

## 2019-04-01 MED ORDER — PHENYLEPHRINE HCL (PRESSORS) 10 MG/ML IV SOLN
INTRAVENOUS | Status: AC
  Filled 2019-04-01: qty 1

## 2019-04-01 MED ORDER — AMLODIPINE BESYLATE 5 MG PO TABS
5.0000 mg | ORAL_TABLET | Freq: Every day | ORAL | Status: DC
  Filled 2019-04-01: qty 1

## 2019-04-01 MED ORDER — SODIUM CHLORIDE 0.9 % IV SOLN
INTRAVENOUS | Status: DC
  Administered 2019-04-01 – 2019-04-02 (×2): via INTRAVENOUS

## 2019-04-01 MED ORDER — BUPIVACAINE-EPINEPHRINE (PF) 0.5% -1:200000 IJ SOLN
INTRAMUSCULAR | Status: AC
  Filled 2019-04-01: qty 30

## 2019-04-01 MED ORDER — SODIUM CHLORIDE 0.9 % IR SOLN
Status: DC | PRN
  Administered 2019-04-01: 1000 mL

## 2019-04-01 MED ORDER — ONDANSETRON HCL 4 MG/2ML IJ SOLN
INTRAMUSCULAR | Status: AC
  Filled 2019-04-01: qty 2

## 2019-04-01 MED ORDER — PHENOL 1.4 % MT LIQD
1.0000 | OROMUCOSAL | Status: DC | PRN
  Filled 2019-04-01: qty 177

## 2019-04-01 MED ORDER — DEXAMETHASONE SODIUM PHOSPHATE 10 MG/ML IJ SOLN
INTRAMUSCULAR | Status: AC
  Filled 2019-04-01: qty 1

## 2019-04-01 MED ORDER — LIDOCAINE 2% (20 MG/ML) 5 ML SYRINGE
INTRAMUSCULAR | Status: AC
  Filled 2019-04-01: qty 5

## 2019-04-01 MED ORDER — ONDANSETRON HCL 4 MG PO TABS: 4.0000 mg | ORAL_TABLET | Freq: Four times a day (QID) | ORAL | Status: DC | PRN

## 2019-04-01 MED ORDER — ONDANSETRON HCL 4 MG/2ML IJ SOLN
INTRAMUSCULAR | Status: DC | PRN
  Administered 2019-04-01: 4 mg via INTRAVENOUS

## 2019-04-01 MED ORDER — DOCUSATE SODIUM 100 MG PO CAPS
100.0000 mg | ORAL_CAPSULE | Freq: Two times a day (BID) | ORAL | Status: DC
  Administered 2019-04-01 – 2019-04-02 (×2): 100 mg via ORAL
  Filled 2019-04-01 (×2): qty 1

## 2019-04-01 MED ORDER — VALSARTAN-HYDROCHLOROTHIAZIDE 320-12.5 MG PO TABS: 1.0000 | ORAL_TABLET | Freq: Every day | ORAL | Status: DC

## 2019-04-01 MED ORDER — MIDAZOLAM HCL 2 MG/2ML IJ SOLN: 1.0000 mg | INTRAMUSCULAR | Status: DC

## 2019-04-01 MED ORDER — FENTANYL CITRATE (PF) 100 MCG/2ML IJ SOLN: 25.0000 ug | INTRAMUSCULAR | Status: DC | PRN

## 2019-04-01 MED ORDER — PROPOFOL 500 MG/50ML IV EMUL: INTRAVENOUS | Status: DC | PRN

## 2019-04-01 MED ORDER — BUPIVACAINE IN DEXTROSE 0.75-8.25 % IT SOLN
INTRATHECAL | Status: DC | PRN
  Administered 2019-04-01: 1.4 mL via INTRATHECAL

## 2019-04-01 MED ORDER — POVIDONE-IODINE 10 % EX SWAB
2.0000 "application " | Freq: Once | CUTANEOUS | Status: AC
  Administered 2019-04-01: 2 via TOPICAL

## 2019-04-01 MED ORDER — SODIUM CHLORIDE (PF) 0.9 % IJ SOLN
INTRAMUSCULAR | Status: AC
  Filled 2019-04-01: qty 50

## 2019-04-01 MED ORDER — CHLORHEXIDINE GLUCONATE 4 % EX LIQD: 60.0000 mL | Freq: Once | CUTANEOUS | Status: DC

## 2019-04-01 MED ORDER — ONDANSETRON HCL 4 MG/2ML IJ SOLN: 4.0000 mg | Freq: Four times a day (QID) | INTRAMUSCULAR | Status: DC | PRN

## 2019-04-01 MED ORDER — HYDROCODONE-ACETAMINOPHEN 7.5-325 MG PO TABS: 1.0000 | ORAL_TABLET | ORAL | Status: DC | PRN

## 2019-04-01 MED ORDER — ASPIRIN 81 MG PO CHEW
81.0000 mg | CHEWABLE_TABLET | Freq: Two times a day (BID) | ORAL | Status: DC
  Administered 2019-04-01 – 2019-04-02 (×2): 81 mg via ORAL
  Filled 2019-04-01 (×2): qty 1

## 2019-04-01 MED ORDER — CEFAZOLIN SODIUM-DEXTROSE 2-4 GM/100ML-% IV SOLN
2.0000 g | Freq: Four times a day (QID) | INTRAVENOUS | Status: AC
  Administered 2019-04-01 (×2): 2 g via INTRAVENOUS
  Filled 2019-04-01 (×2): qty 100

## 2019-04-01 MED ORDER — STERILE WATER FOR IRRIGATION IR SOLN
Status: DC | PRN
  Administered 2019-04-01 (×2): 1000 mL

## 2019-04-01 MED ORDER — SODIUM CHLORIDE 0.9 % IV SOLN
INTRAVENOUS | Status: DC | PRN
  Administered 2019-04-01: 20 ug/min via INTRAVENOUS

## 2019-04-01 MED ORDER — HYDROCHLOROTHIAZIDE 12.5 MG PO CAPS
12.5000 mg | ORAL_CAPSULE | Freq: Every day | ORAL | Status: DC
  Filled 2019-04-01: qty 1

## 2019-04-01 MED ORDER — PROPOFOL 10 MG/ML IV BOLUS
INTRAVENOUS | Status: AC
  Filled 2019-04-01: qty 40

## 2019-04-01 MED ORDER — FENTANYL CITRATE (PF) 100 MCG/2ML IJ SOLN
50.0000 ug | INTRAMUSCULAR | Status: DC
  Administered 2019-04-01: 50 ug via INTRAVENOUS
  Filled 2019-04-01: qty 2

## 2019-04-01 MED ORDER — OXYCODONE HCL 5 MG PO TABS: 5.0000 mg | ORAL_TABLET | Freq: Once | ORAL | Status: DC | PRN

## 2019-04-01 SURGICAL SUPPLY — 62 items
ADH SKN CLS APL DERMABOND .7 (GAUZE/BANDAGES/DRESSINGS) ×1
ATTUNE MED ANAT PAT 38 KNEE (Knees) ×1 IMPLANT
ATTUNE PS FEM LT SZ 6 CEM KNEE (Femur) ×1 IMPLANT
ATTUNE PSRP INSR SZ6 8 KNEE (Insert) ×1 IMPLANT
BAG SPEC THK2 15X12 ZIP CLS (MISCELLANEOUS)
BAG ZIPLOCK 12X15 (MISCELLANEOUS) IMPLANT
BASE TIBIAL ROT PLAT SZ 7 KNEE (Knees) IMPLANT
BLADE SAW SGTL 11.0X1.19X90.0M (BLADE) IMPLANT
BLADE SAW SGTL 13.0X1.19X90.0M (BLADE) ×2 IMPLANT
BLADE SURG SZ10 CARB STEEL (BLADE) ×4 IMPLANT
BNDG ELASTIC 6X5.8 VLCR STR LF (GAUZE/BANDAGES/DRESSINGS) ×2 IMPLANT
BOWL SMART MIX CTS (DISPOSABLE) ×2 IMPLANT
BSPLAT TIB 7 CMNT ROT PLAT STR (Knees) ×1 IMPLANT
CEMENT HV SMART SET (Cement) ×2 IMPLANT
COVER SURGICAL LIGHT HANDLE (MISCELLANEOUS) ×2 IMPLANT
COVER WAND RF STERILE (DRAPES) IMPLANT
CUFF TOURN SGL QUICK 34 (TOURNIQUET CUFF) ×2
CUFF TRNQT CYL 34X4.125X (TOURNIQUET CUFF) ×1 IMPLANT
DECANTER SPIKE VIAL GLASS SM (MISCELLANEOUS) ×4 IMPLANT
DERMABOND ADVANCED (GAUZE/BANDAGES/DRESSINGS) ×1
DERMABOND ADVANCED .7 DNX12 (GAUZE/BANDAGES/DRESSINGS) ×1 IMPLANT
DRAPE U-SHAPE 47X51 STRL (DRAPES) ×2 IMPLANT
DRESSING AQUACEL AG SP 3.5X10 (GAUZE/BANDAGES/DRESSINGS) ×1 IMPLANT
DRSG AQUACEL AG SP 3.5X10 (GAUZE/BANDAGES/DRESSINGS) ×2
DURAPREP 26ML APPLICATOR (WOUND CARE) ×4 IMPLANT
ELECT REM PT RETURN 15FT ADLT (MISCELLANEOUS) ×2 IMPLANT
GLOVE BIO SURGEON STRL SZ 6 (GLOVE) ×2 IMPLANT
GLOVE BIOGEL PI IND STRL 6.5 (GLOVE) ×1 IMPLANT
GLOVE BIOGEL PI IND STRL 7.5 (GLOVE) ×1 IMPLANT
GLOVE BIOGEL PI IND STRL 8.5 (GLOVE) ×1 IMPLANT
GLOVE BIOGEL PI INDICATOR 6.5 (GLOVE) ×1
GLOVE BIOGEL PI INDICATOR 7.5 (GLOVE) ×1
GLOVE BIOGEL PI INDICATOR 8.5 (GLOVE) ×1
GLOVE ECLIPSE 8.0 STRL XLNG CF (GLOVE) ×2 IMPLANT
GLOVE ORTHO TXT STRL SZ7.5 (GLOVE) ×2 IMPLANT
GOWN STRL REUS W/ TWL LRG LVL3 (GOWN DISPOSABLE) ×1 IMPLANT
GOWN STRL REUS W/TWL 2XL LVL3 (GOWN DISPOSABLE) ×2 IMPLANT
GOWN STRL REUS W/TWL LRG LVL3 (GOWN DISPOSABLE) ×4 IMPLANT
HANDPIECE INTERPULSE COAX TIP (DISPOSABLE) ×2
HOLDER FOLEY CATH W/STRAP (MISCELLANEOUS) IMPLANT
KIT TURNOVER KIT A (KITS) IMPLANT
MANIFOLD NEPTUNE II (INSTRUMENTS) ×2 IMPLANT
NDL SAFETY ECLIPSE 18X1.5 (NEEDLE) IMPLANT
NEEDLE HYPO 18GX1.5 SHARP (NEEDLE)
NS IRRIG 1000ML POUR BTL (IV SOLUTION) ×2 IMPLANT
PACK TOTAL KNEE CUSTOM (KITS) ×2 IMPLANT
PIN DRILL FIX HALF THREAD (BIT) ×1 IMPLANT
PIN FIX SIGMA LCS THRD HI (PIN) ×1 IMPLANT
PROTECTOR NERVE ULNAR (MISCELLANEOUS) ×2 IMPLANT
SET HNDPC FAN SPRY TIP SCT (DISPOSABLE) ×1 IMPLANT
SET PAD KNEE POSITIONER (MISCELLANEOUS) ×2 IMPLANT
SUT MNCRL AB 4-0 PS2 18 (SUTURE) ×2 IMPLANT
SUT STRATAFIX PDS+ 0 24IN (SUTURE) ×2 IMPLANT
SUT VIC AB 1 CT1 36 (SUTURE) ×2 IMPLANT
SUT VIC AB 2-0 CT1 27 (SUTURE) ×6
SUT VIC AB 2-0 CT1 TAPERPNT 27 (SUTURE) ×3 IMPLANT
SYR 3ML LL SCALE MARK (SYRINGE) ×2 IMPLANT
TIBIAL BASE ROT PLAT SZ 7 KNEE (Knees) ×2 IMPLANT
TRAY FOLEY MTR SLVR 16FR STAT (SET/KITS/TRAYS/PACK) ×2 IMPLANT
WATER STERILE IRR 1000ML POUR (IV SOLUTION) ×4 IMPLANT
WRAP KNEE MAXI GEL POST OP (GAUZE/BANDAGES/DRESSINGS) ×2 IMPLANT
YANKAUER SUCT BULB TIP 10FT TU (MISCELLANEOUS) ×2 IMPLANT

## 2019-04-01 NOTE — Progress Notes (Signed)
AssistedDr. Brock with left, ultrasound guided, adductor canal block. Side rails up, monitors on throughout procedure. See vital signs in flow sheet. Tolerated Procedure well.  

## 2019-04-01 NOTE — Transfer of Care (Signed)
Immediate Anesthesia Transfer of Care Note  Patient: Michelle Mosley  Procedure(s) Performed: TOTAL KNEE ARTHROPLASTY (Left Knee)  Patient Location: PACU  Anesthesia Type:Regional and Spinal  Level of Consciousness: sedated  Airway & Oxygen Therapy: Patient Spontanous Breathing and Patient connected to face mask oxygen  Post-op Assessment: Report given to RN and Post -op Vital signs reviewed and stable  Post vital signs: Reviewed and stable  Last Vitals:  Vitals Value Taken Time  BP    Temp    Pulse 61 04/01/19 1203  Resp 15 04/01/19 1203  SpO2 100 % 04/01/19 1203  Vitals shown include unvalidated device data.  Last Pain:  Vitals:   04/01/19 0828  TempSrc:   PainSc: 0-No pain      Patients Stated Pain Goal: 4 ([id number])  Complications: No apparent anesthesia complications

## 2019-04-01 NOTE — Op Note (Signed)
NAME:  Michelle Mosley RECORD NO.:  [id number]                             FACILITY:  [facility]      PHYSICIAN:  Pietro Cassis. Alvan Dame, M.D.  DATE OF BIRTH:  1932-07-24      DATE OF PROCEDURE:  04/01/2019                                     OPERATIVE REPORT         PREOPERATIVE DIAGNOSIS:  Left knee osteoarthritis.      POSTOPERATIVE DIAGNOSIS:  Left knee osteoarthritis.      FINDINGS:  The patient was noted to have complete loss of cartilage and   bone-on-bone arthritis with associated osteophytes in the medial and patellofemoral compartments of   the knee with a significant synovitis and associated effusion.  The patient had failed months of conservative treatment including medications, injection therapy, activity modification.     PROCEDURE:  Left total knee replacement.      COMPONENTS USED:  DePuy Attune rotating platform posterior stabilized knee   system, a size 6 femur, 7 tibia, size 8 mm PS AOX insert, and 38 anatomic patellar   button.      SURGEON:  Pietro Cassis. Alvan Dame, M.D.      ASSISTANT:  Danae Orleans, PA-C.      ANESTHESIA:  Regional and Spinal.      SPECIMENS:  None.      COMPLICATION:  None.      DRAINS:  None.  EBL: <100cc      TOURNIQUET TIME:  36 min at 250 mmHg.      The patient was stable to the recovery room.      INDICATION FOR PROCEDURE:  Michelle Mosley is a 85 y.o. female patient of   mine.  The patient had been seen, evaluated, and treated for months conservatively in the   office with medication, activity modification, and injections.  The patient had   radiographic changes of bone-on-bone arthritis with endplate sclerosis and osteophytes noted.  Based on the radiographic changes and failed conservative measures, the patient   decided to proceed with definitive treatment, total knee replacement.  Risks of infection, DVT, component failure, need for revision surgery, neurovascular injury were reviewed in the office setting.   The postop course was reviewed stressing the efforts to maximize post-operative satisfaction and function.  Consent was obtained for benefit of pain   relief.      PROCEDURE IN DETAIL:  The patient was brought to the operative theater.   Once adequate anesthesia, preoperative antibiotics, 2 gm of Ancef,1 gm of Tranexamic Acid, and 10 mg of Decadron administered, the patient was positioned supine with a left thigh tourniquet placed.  The  left lower extremity was prepped and draped in sterile fashion.  A time-   out was performed identifying the patient, planned procedure, and the appropriate extremity.      The left lower extremity was placed in the [facility] leg holder.  The leg was   exsanguinated, tourniquet elevated to 250 mmHg.  A midline incision was   made followed by median parapatellar arthrotomy.  Following initial   exposure, attention was  first directed to the patella.  Precut   measurement was noted to be 24 mm.  I resected down to 14 mm and used a   38 anatomic patellar button to restore patellar height as well as cover the cut surface.      The lug holes were drilled and a metal shim was placed to protect the   patella from retractors and saw blade during the procedure.      At this point, attention was now directed to the femur.  The femoral   canal was opened with a drill, irrigated to try to prevent fat emboli.  An   intramedullary rod was passed at 3 degrees valgus, 9 mm of bone was   resected off the distal femur.  Following this resection, the tibia was   subluxated anteriorly.  Using the extramedullary guide, 2 mm of bone was resected off   the proximal medial tibia.  We confirmed the gap would be   stable medially and laterally with a size 5 spacer block as well as confirmed that the tibial cut was perpendicular in the coronal plane, checking with an alignment rod.      Once this was done, I sized the femur to be a size 6 in the anterior-   posterior dimension, chose a  standard component based on medial and   lateral dimension.  The size 6 rotation block was then pinned in   position anterior referenced using the C-clamp to set rotation.  The   anterior, posterior, and  chamfer cuts were made without difficulty nor   notching making certain that I was along the anterior cortex to help   with flexion gap stability.      The final box cut was made off the lateral aspect of distal femur.      At this point, the tibia was sized to be a size 7.  The size 7 tray was   then pinned in position through the medial third of the tubercle,   drilled, and keel punched.  Trial reduction was now carried with a 6 femur,  7 tibia, a size 8 mm PS insert, and the 38 anatomic patella botton.  The knee was brought to full extension with good flexion stability with the patella   tracking through the trochlea without application of pressure.  Given   all these findings the trial components removed.  Final components were   opened and cement was mixed.  The knee was irrigated with normal saline solution and pulse lavage.  The synovial lining was   then injected with 30 cc of 0.25% Marcaine with epinephrine, 1 cc of Toradol and 30 cc of NS for a total of 61 cc.     Final implants were then cemented onto cleaned and dried cut surfaces of bone with the knee brought to extension with a size 8 mm PS trial insert.      Once the cement had fully cured, excess cement was removed   throughout the knee.  I confirmed that I was satisfied with the range of   motion and stability, and the final size 8 mm PS AOX insert was chosen.  It was   placed into the knee.      The tourniquet had been let down at 36 minutes.  No significant   hemostasis was required.  The extensor mechanism was then reapproximated using #1 Vicryl and #1 Stratafix sutures with the knee   in flexion.  The  remaining wound was closed with 2-0 Vicryl and running 4-0 Monocryl.   The knee was cleaned, dried, dressed  sterilely using Dermabond and   Aquacel dressing.  The patient was then   brought to recovery room in stable condition, tolerating the procedure   well.   Please note that Physician Assistant, Danae Orleans, PA-C was present for the entirety of the case, and was utilized for pre-operative positioning, peri-operative retractor management, general facilitation of the procedure and for primary wound closure at the end of the case.              Pietro Cassis Alvan Dame, M.D.    04/01/2019 10:07 AM

## 2019-04-01 NOTE — Anesthesia Procedure Notes (Signed)
Spinal  Patient location during procedure: OR Start time: 04/01/2019 10:03 AM End time: 04/01/2019 10:05 AM Staffing Anesthesiologist: Audry Pili, MD Performed: anesthesiologist  Preanesthetic Checklist Completed: patient identified, surgical consent, pre-op evaluation, timeout performed, IV checked, risks and benefits discussed and monitors and equipment checked Spinal Block Patient position: sitting Prep: DuraPrep Patient monitoring: heart rate, cardiac monitor, continuous pulse ox and blood pressure Approach: midline Location: L3-4 Injection technique: single-shot Needle Needle type: Pencan  Needle gauge: 24 G Additional Notes Consent was obtained prior to the procedure with all questions answered and concerns addressed. Risks including, but not limited to, bleeding, infection, nerve damage, paralysis, failed block, inadequate analgesia, allergic reaction, high spinal, itching, and headache were discussed and the patient wished to proceed. Functioning IV was confirmed and monitors were applied. Sterile prep and drape, including hand hygiene, mask, and sterile gloves were used. The patient was positioned and the spine was prepped. The skin was anesthetized with lidocaine. Free flow of clear CSF was obtained prior to injecting local anesthetic into the CSF. The spinal needle aspirated freely following injection. The needle was carefully withdrawn. The patient tolerated the procedure well.   Renold Don, MD

## 2019-04-01 NOTE — Anesthesia Procedure Notes (Signed)
Anesthesia Regional Block: Adductor canal block   Pre-Anesthetic Checklist: ,, timeout performed, Correct Patient, Correct Site, Correct Laterality, Correct Procedure, Correct Position, site marked, Risks and benefits discussed,  Surgical consent,  Pre-op evaluation,  At surgeon's request and post-op pain management  Laterality: Left  Prep: chloraprep       Needles:  Injection technique: Single-shot  Needle Type: Echogenic Needle     Needle Length: 10cm  Needle Gauge: 21     Additional Needles:   Narrative:  Start time: 04/01/2019 9:12 AM End time: 04/01/2019 9:15 AM Injection made incrementally with aspirations every 5 mL.  Performed by: Personally  Anesthesiologist: Audry Pili, MD  Additional Notes: No pain on injection. No increased resistance to injection. Injection made in 5cc increments. Good needle visualization. Patient tolerated the procedure well.

## 2019-04-01 NOTE — Discharge Instructions (Signed)

## 2019-04-01 NOTE — Interval H&P Note (Signed)
History and Physical Interval Note:  04/01/2019 8:37 AM  Michelle Mosley  has presented today for surgery, with the diagnosis of Left knee osteoarthritis.  The various methods of treatment have been discussed with the patient and family. After consideration of risks, benefits and other options for treatment, the patient has consented to  Procedure(s) with comments: TOTAL KNEE ARTHROPLASTY (Left) - 70 mins as a surgical intervention.  The patient's history has been reviewed, patient examined, no change in status, stable for surgery.  I have reviewed the patient's chart and labs.  Questions were answered to the patient's satisfaction.     Mauri Pole

## 2019-04-01 NOTE — Evaluation (Signed)
Physical Therapy Evaluation Patient Details Name: Michelle Mosley MRN: [id number] DOB: Apr 06, 1933 Today's Date: 04/01/2019   History of Present Illness  Pt s/p L TKR  Clinical Impression  Pt s/p L TKR and presents with decreased L LE strength/ROM and post op pain limiting functional mobility.  Pt should progress to dc son's home with family assist.    Follow Up Recommendations Follow surgeon's recommendation for DC plan and follow-up therapies    Equipment Recommendations  None recommended by PT    Recommendations for Other Services       Precautions / Restrictions Precautions Precautions: Knee Restrictions Weight Bearing Restrictions: No LLE Weight Bearing: Weight bearing as tolerated      Mobility  Bed Mobility Overal bed mobility: Needs Assistance Bed Mobility: Supine to Sit     Supine to sit: Min guard     General bed mobility comments: min guard L LE  Transfers Overall transfer level: Needs assistance Equipment used: Rolling walker (2 wheeled) Transfers: Sit to/from Stand Sit to Stand: Min assist         General transfer comment: cues for LE management and use of UEs to self assist  Ambulation/Gait Ambulation/Gait assistance: Min assist Gait Distance (Feet): 85 Feet Assistive device: Rolling walker (2 wheeled) Gait Pattern/deviations: Step-to pattern;Decreased step length - right;Decreased step length - left;Shuffle;Trunk flexed Gait velocity: decr   General Gait Details: cues for sequence, posture and position from [employer]            Wheelchair Mobility    Modified Rankin (Stroke Patients Only)       Balance Overall balance assessment: Needs assistance Sitting-balance support: No upper extremity supported;Feet supported Sitting balance-Leahy Scale: Good     Standing balance support: Bilateral upper extremity supported Standing balance-Leahy Scale: Poor                               Pertinent Vitals/Pain Pain  Assessment: 0-10 Pain Score: 3  Pain Location: L knee Pain Descriptors / Indicators: Aching;Sore Pain Intervention(s): Limited activity within patient's tolerance;Monitored during session;Premedicated before session;Ice applied    Home Living Family/patient expects to be discharged to:: Private residence Living Arrangements: Alone Available Help at Discharge: Family Type of Home: House Home Access: Stairs to enter   Technical brewer of Steps: 1 Home Layout: One level Home Equipment: Environmental consultant - 2 wheels;Cane - single point;Bedside commode Additional Comments: Pt will be staying with son - above information relates to son's home    Prior Function Level of Independence: Independent               Hand Dominance        Extremity/Trunk Assessment   Upper Extremity Assessment Upper Extremity Assessment: Overall WFL for tasks assessed    Lower Extremity Assessment Lower Extremity Assessment: LLE deficits/detail    Cervical / Trunk Assessment Cervical / Trunk Assessment: Lordotic  Communication   Communication: No difficulties;HOH  Cognition Arousal/Alertness: Awake/alert Behavior During Therapy: WFL for tasks assessed/performed Overall Cognitive Status: Within Functional Limits for tasks assessed                                        General Comments      Exercises Total Joint Exercises Ankle Circles/Pumps: AROM;Both;15 reps;Supine   Assessment/Plan    PT Assessment Patient needs continued PT services  PT  Problem List Decreased strength;Decreased range of motion;Decreased activity tolerance;Decreased mobility;Decreased balance;Decreased knowledge of use of DME;Pain       PT Treatment Interventions DME instruction;Gait training;Stair training;Functional mobility training;Therapeutic activities;Therapeutic exercise;Patient/family education    PT Goals (Current goals can be found in the Care Plan section)  Acute Rehab PT Goals Patient  Stated Goal: Regain IND PT Goal Formulation: With patient Time For Goal Achievement: 04/08/19 Potential to Achieve Goals: Good    Frequency 7X/week   Barriers to discharge        Co-evaluation               AM-PAC PT "6 Clicks" Mobility  Outcome Measure Help needed turning from your back to your side while in a flat bed without using bedrails?: A Little Help needed moving from lying on your back to sitting on the side of a flat bed without using bedrails?: A Little Help needed moving to and from a bed to a chair (including a wheelchair)?: A Little Help needed standing up from a chair using your arms (e.g., wheelchair or bedside chair)?: A Little Help needed to walk in hospital room?: A Little Help needed climbing 3-5 steps with a railing? : A Little 6 Click Score: 18    End of Session Equipment Utilized During Treatment: Gait belt Activity Tolerance: Patient tolerated treatment well Patient left: in chair;with call bell/phone within reach;with chair alarm set;with family/visitor present Nurse Communication: Mobility status PT Visit Diagnosis: Difficulty in walking, not elsewhere classified (R26.2)    Time: [id number] PT Time Calculation (min) (ACUTE ONLY): 28 min   Charges:   PT Evaluation $PT Eval Low Complexity: 1 Low PT Treatments $Gait Training: 8-22 mins        Debe Coder PT Acute Rehabilitation Services Pager [phone] Office [phone]   Michelle Mosley 04/01/2019, 5:06 PM

## 2019-04-01 NOTE — Anesthesia Preprocedure Evaluation (Addendum)
Anesthesia Evaluation  Patient identified by MRN, date of birth, ID band Patient awake    Reviewed: Allergy & Precautions, NPO status , Patient's Chart, lab work & pertinent test results, reviewed documented beta blocker date and time   History of Anesthesia Complications Negative for: history of anesthetic complications  Airway Mallampati: II  TM Distance: >3 FB Neck ROM: Full    Dental  (+) Dental Advisory Given, Teeth Intact   Pulmonary neg pulmonary ROS,    Pulmonary exam normal        Cardiovascular hypertension, Pt. on medications and Pt. on home beta blockers Normal cardiovascular exam     Neuro/Psych negative neurological ROS  negative psych ROS   GI/Hepatic negative GI ROS, Neg liver ROS,   Endo/Other  negative endocrine ROS  Renal/GU negative Renal ROS     Musculoskeletal  (+) Arthritis , Osteoarthritis,    Abdominal   Peds  Hematology negative hematology ROS (+)   Anesthesia Other Findings   Reproductive/Obstetrics                            Anesthesia Physical Anesthesia Plan  ASA: II  Anesthesia Plan: Spinal   Post-op Pain Management:  Regional for Post-op pain   Induction:   PONV Risk Score and Plan: 2 and Treatment may vary due to age or medical condition and Propofol infusion  Airway Management Planned: Natural Airway and Simple Face Mask  Additional Equipment: None  Intra-op Plan:   Post-operative Plan:   Informed Consent: I have reviewed the patients History and Physical, chart, labs and discussed the procedure including the risks, benefits and alternatives for the proposed anesthesia with the patient or authorized representative who has indicated his/her understanding and acceptance.       Plan Discussed with: CRNA and Anesthesiologist  Anesthesia Plan Comments: (Labs reviewed, platelets acceptable. Discussed risks and benefits of spinal, including  spinal/epidural hematoma, infection, failed block, and PDPH. Patient expressed understanding and wished to proceed. )       Anesthesia Quick Evaluation

## 2019-04-02 ENCOUNTER — Encounter (HOSPITAL_COMMUNITY): Payer: Self-pay | Admitting: Orthopedic Surgery

## 2019-04-02 DIAGNOSIS — E663 Overweight: Secondary | ICD-10-CM | POA: Diagnosis present

## 2019-04-02 DIAGNOSIS — I10 Essential (primary) hypertension: Secondary | ICD-10-CM | POA: Diagnosis not present

## 2019-04-02 DIAGNOSIS — Z7982 Long term (current) use of aspirin: Secondary | ICD-10-CM | POA: Diagnosis not present

## 2019-04-02 DIAGNOSIS — M1712 Unilateral primary osteoarthritis, left knee: Secondary | ICD-10-CM | POA: Diagnosis not present

## 2019-04-02 DIAGNOSIS — Z86718 Personal history of other venous thrombosis and embolism: Secondary | ICD-10-CM | POA: Diagnosis not present

## 2019-04-02 DIAGNOSIS — Z79899 Other long term (current) drug therapy: Secondary | ICD-10-CM | POA: Diagnosis not present

## 2019-04-02 LAB — BASIC METABOLIC PANEL
Anion gap: 8 (ref 5–15)
BUN: 17 mg/dL (ref 8–23)
CO2: 23 mmol/L (ref 22–32)
Calcium: 8.2 mg/dL — ABNORMAL LOW (ref 8.9–10.3)
Chloride: 99 mmol/L (ref 98–111)
Creatinine, Ser: 0.67 mg/dL (ref 0.44–1.00)
GFR calc Af Amer: >60 mL/min (ref >60)
GFR calc non Af Amer: >60 mL/min (ref >60)
Glucose, Bld: 128 mg/dL — ABNORMAL HIGH (ref 70–99)
Potassium: 4.1 mmol/L (ref 3.5–5.1)
Sodium: 130 mmol/L — ABNORMAL LOW (ref 135–145)

## 2019-04-02 LAB — CBC
HCT: 27.3 % — ABNORMAL LOW (ref 36.0–46.0)
Hemoglobin: 8.9 g/dL — ABNORMAL LOW (ref 12.0–15.0)
MCH: 31.6 pg (ref 26.0–34.0)
MCHC: 32.6 g/dL (ref 30.0–36.0)
MCV: 96.8 fL (ref 80.0–100.0)
Platelets: 177 10*3/uL (ref 150–400)
RBC: 2.82 MIL/uL — ABNORMAL LOW (ref 3.87–5.11)
RDW: 12.7 % (ref 11.5–15.5)
WBC: 9.5 10*3/uL (ref 4.0–10.5)
nRBC: 0 % (ref 0.0–0.2)

## 2019-04-02 MED ORDER — ASPIRIN 81 MG PO CHEW: 81.0000 mg | CHEWABLE_TABLET | Freq: Two times a day (BID) | ORAL | 0 refills | Status: AC

## 2019-04-02 MED ORDER — METHOCARBAMOL 500 MG PO TABS: 500.0000 mg | ORAL_TABLET | Freq: Four times a day (QID) | ORAL | 0 refills | Status: DC | PRN

## 2019-04-02 MED ORDER — HYDROCODONE-ACETAMINOPHEN 5-325 MG PO TABS: 1.0000 | ORAL_TABLET | Freq: Four times a day (QID) | ORAL | 0 refills | Status: DC | PRN

## 2019-04-02 MED ORDER — POLYETHYLENE GLYCOL 3350 17 G PO PACK: 17.0000 g | PACK | Freq: Two times a day (BID) | ORAL | 0 refills | Status: DC

## 2019-04-02 MED ORDER — DOCUSATE SODIUM 100 MG PO CAPS: 100.0000 mg | ORAL_CAPSULE | Freq: Two times a day (BID) | ORAL | 0 refills | Status: DC

## 2019-04-02 MED ORDER — FERROUS SULFATE 325 (65 FE) MG PO TABS: 325.0000 mg | ORAL_TABLET | Freq: Three times a day (TID) | ORAL | 0 refills | Status: DC

## 2019-04-02 NOTE — Progress Notes (Signed)
     Subjective: 1 Day Post-Op Procedure(s) (LRB): TOTAL KNEE ARTHROPLASTY (Left)   Patient reports pain as mild, pain controlled medication.  No reported events throughout the night.  Patient feels that she is doing well and is looking forward to improving.  The procedure, findings and expectations moving forward were discussed with the patient.  Patient is ready be discharged home, she does well with therapy.  Patient follow-up in the clinic in 2 weeks.  Patient knows to call with any questions or concerns.  Patient states her outpatient therapy is starting on the 22nd of this month.   Patient's anticipated LOS is less than 2 midnights, meeting these requirements: - Lives within 1 hour of care - Has a competent adult at home to recover with post-op recover - NO history of  - Chronic pain requiring opiods  - Diabetes  - Coronary Artery Disease  - Heart failure  - Heart attack  - Stroke  - DVT/VTE  - Cardiac arrhythmia  - Respiratory Failure/COPD  - Renal failure  - Anemia  - Advanced Liver disease     Objective:   VITALS:   Vitals:   04/02/19 0122 04/02/19 0558  BP: 133/63 (!) 147/71  Pulse: 64 74  Resp: 16 16  Temp: 97.6 F (36.4 C) 98.2 F (36.8 C)  SpO2: 100% 100%    Dorsiflexion/Plantar flexion intact Incision: dressing C/D/I No cellulitis present Compartment soft  LABS Recent Labs    04/02/19 0243  HGB 8.9*  HCT 27.3*  WBC 9.5  PLT 177    Recent Labs    04/01/19 0822 04/02/19 0243  NA 134* 130*  K 3.5 4.1  BUN 14 17  CREATININE 0.66 0.67  GLUCOSE 108* 128*     Assessment/Plan: 1 Day Post-Op Procedure(s) (LRB): TOTAL KNEE ARTHROPLASTY (Left) Foley cath DC'd Advance diet Up with therapy D/C IV fluids Discharge home Follow up in 2 weeks at [facility] Follow up with OLIN,Ronneisha Jett D in 2 weeks.  Contact information:  [facility] [address] [place] [id number]    Overweight (BMI 25-29.9)  Estimated body mass index is 24.96 kg/m as calculated from the following:   Height as of this encounter: 5\' 5"  (1.651 m).   Weight as of this encounter: 68 kg. Patient also counseled that weight may inhibit the healing process Patient counseled that losing weight will help with future health issues          [place] Pugh. Christop[place]   PAC  04/02/2019, 8:09 AM

## 2019-04-02 NOTE — Progress Notes (Signed)
Physical Therapy Treatment Patient Details Name: Michelle Mosley MRN: [id number] DOB: 09-02-1932 Today's Date: 04/02/2019    History of Present Illness Pt s/p L TKR    PT Comments    Pt progressing well with therex program and mobility but ltd by c/o dizziness with ambulation - BP 85/50 - RN aware.   Follow Up Recommendations  Follow surgeon's recommendation for DC plan and follow-up therapies     Equipment Recommendations  None recommended by PT    Recommendations for Other Services       Precautions / Restrictions Precautions Precautions: Knee Restrictions Weight Bearing Restrictions: No LLE Weight Bearing: Weight bearing as tolerated    Mobility  Bed Mobility Overal bed mobility: Needs Assistance Bed Mobility: Supine to Sit     Supine to sit: Supervision        Transfers Overall transfer level: Needs assistance Equipment used: Rolling walker (2 wheeled) Transfers: Sit to/from Stand Sit to Stand: Min guard         General transfer comment: cues for LE management and use of UEs to self assist  Ambulation/Gait Ambulation/Gait assistance: Min guard Gait Distance (Feet): 130 Feet Assistive device: Rolling walker (2 wheeled) Gait Pattern/deviations: Step-to pattern;Decreased step length - right;Decreased step length - left;Shuffle;Trunk flexed Gait velocity: decr   General Gait Details: cues for sequence, posture and position from RW; distance ltd by c/o dizziness   Stairs             Wheelchair Mobility    Modified Rankin (Stroke Patients Only)       Balance Overall balance assessment: Needs assistance Sitting-balance support: No upper extremity supported;Feet supported Sitting balance-Leahy Scale: Good     Standing balance support: Bilateral upper extremity supported Standing balance-Leahy Scale: Poor                              Cognition Arousal/Alertness: Awake/alert Behavior During Therapy: WFL for tasks  assessed/performed Overall Cognitive Status: Within Functional Limits for tasks assessed                                        Exercises Total Joint Exercises Ankle Circles/Pumps: AROM;Both;15 reps;Supine Quad Sets: AROM;Both;10 reps;Supine Heel Slides: AAROM;Left;Supine;15 reps Straight Leg Raises: AAROM;AROM;Left;15 reps;Supine    General Comments        Pertinent Vitals/Pain Pain Assessment: 0-10 Pain Score: 5  Pain Location: L knee Pain Descriptors / Indicators: Aching;Sore Pain Intervention(s): Limited activity within patient's tolerance;Monitored during session;Premedicated before session;Ice applied    Home Living                      Prior Function            PT Goals (current goals can now be found in the care plan section) Acute Rehab PT Goals Patient Stated Goal: Regain IND PT Goal Formulation: With patient Time For Goal Achievement: 04/08/19 Potential to Achieve Goals: Good Progress towards PT goals: Progressing toward goals    Frequency    7X/week      PT Plan Current plan remains appropriate    Co-evaluation              AM-PAC PT "6 Clicks" Mobility   Outcome Measure  Help needed turning from your back to your side while in a flat bed without using bedrails?: A  Little Help needed moving from lying on your back to sitting on the side of a flat bed without using bedrails?: A Little Help needed moving to and from a bed to a chair (including a wheelchair)?: A Little Help needed standing up from a chair using your arms (e.g., wheelchair or bedside chair)?: A Little Help needed to walk in hospital room?: A Little Help needed climbing 3-5 steps with a railing? : A Little 6 Click Score: 18    End of Session Equipment Utilized During Treatment: Gait belt Activity Tolerance: Patient tolerated treatment well Patient left: in chair;with call bell/phone within reach;with chair alarm set;with family/visitor present Nurse  Communication: Mobility status PT Visit Diagnosis: Difficulty in walking, not elsewhere classified (R26.2)     Time: [id number] PT Time Calculation (min) (ACUTE ONLY): 35 min  Charges:  $Gait Training: 8-22 mins $Therapeutic Exercise: 8-22 mins                     [place] Pager [phone] Office [phone]    Martin Belling 04/02/2019, 12:41 PM

## 2019-04-02 NOTE — Progress Notes (Signed)
Physical Therapy Treatment Patient Details Name: Michelle Mosley MRN: [id number] DOB: [birth year] Today's Date: 04/02/2019    History of Present Illness Pt s/p L TKR    PT Comments    Pt motivated and progressing well.  Pt reviewed stairs and home therex program with written instruction provided and reviewed.  Pt will be staying with son who had recent TKR as well.   Follow Up Recommendations  Follow surgeon's recommendation for DC plan and follow-up therapies     Equipment Recommendations  None recommended by PT    Recommendations for Other Services       Precautions / Restrictions Precautions Precautions: Knee Restrictions Weight Bearing Restrictions: No LLE Weight Bearing: Weight bearing as tolerated    Mobility  Bed Mobility Overal bed mobility: Needs Assistance Bed Mobility: Supine to Sit     Supine to sit: Supervision     General bed mobility comments: Pt up in chair and requests back to same  Transfers Overall transfer level: Needs assistance Equipment used: Rolling walker (2 wheeled) Transfers: Sit to/from Stand Sit to Stand: Min guard;Supervision         General transfer comment: cues for LE management and use of UEs to self assist  Ambulation/Gait Ambulation/Gait assistance: Min guard;Supervision Gait Distance (Feet): 100 Feet Assistive device: Rolling walker (2 wheeled) Gait Pattern/deviations: Step-to pattern;Decreased step length - right;Decreased step length - left;Shuffle;Trunk flexed Gait velocity: decr   General Gait Details: cues for sequence, posture and position from RW; no c/o dizziness   Stairs Stairs: Yes Stairs assistance: Min assist Stair Management: No rails;Step to pattern;Forwards;With walker Number of Stairs: 2 General stair comments: single step twice with cues for sequence and foot/RW placement   Wheelchair Mobility    Modified Rankin (Stroke Patients Only)       Balance Overall balance assessment: Needs  assistance Sitting-balance support: No upper extremity supported;Feet supported Sitting balance-Leahy Scale: Good     Standing balance support: Bilateral upper extremity supported Standing balance-Leahy Scale: Fair                              Cognition Arousal/Alertness: Awake/alert Behavior During Therapy: WFL for tasks assessed/performed Overall Cognitive Status: Within Functional Limits for tasks assessed                                        Exercises Total Joint Exercises Ankle Circles/Pumps: AROM;Both;15 reps;Supine Quad Sets: AROM;Both;10 reps;Supine Heel Slides: AAROM;Left;Supine;15 reps Straight Leg Raises: AAROM;AROM;Left;15 reps;Supine Long Arc Quad: AROM;Left;10 reps;Seated Knee Flexion: AROM;AAROM;Left;10 reps;Seated    General Comments        Pertinent Vitals/Pain Pain Assessment: 0-10 Pain Score: 5  Pain Location: L knee Pain Descriptors / Indicators: Aching;Sore Pain Intervention(s): Limited activity within patient's tolerance;Monitored during session;Premedicated before session;Ice applied    Home Living                      Prior Function            PT Goals (current goals can now be found in the care plan section) Acute Rehab PT Goals Patient Stated Goal: Regain IND PT Goal Formulation: With patient Time For Goal Achievement: 04/08/19 Potential to Achieve Goals: Good Progress towards PT goals: Progressing toward goals    Frequency    7X/week      PT Plan  Current plan remains appropriate    Co-evaluation              AM-PAC PT "6 Clicks" Mobility   Outcome Measure  Help needed turning from your back to your side while in a flat bed without using bedrails?: A Little Help needed moving from lying on your back to sitting on the side of a flat bed without using bedrails?: A Little Help needed moving to and from a bed to a chair (including a wheelchair)?: A Little Help needed standing up from  a chair using your arms (e.g., wheelchair or bedside chair)?: A Little Help needed to walk in hospital room?: A Little Help needed climbing 3-5 steps with a railing? : A Little 6 Click Score: 18    End of Session Equipment Utilized During Treatment: Gait belt Activity Tolerance: Patient tolerated treatment well Patient left: in chair;with call bell/phone within reach;with chair alarm set;with family/visitor present Nurse Communication: Mobility status PT Visit Diagnosis: Difficulty in walking, not elsewhere classified (R26.2)     Time: [id number] PT Time Calculation (min) (ACUTE ONLY): 42 min  Charges:  $Gait Training: 8-22 mins $Therapeutic Exercise: 23-37 mins                     [place] Pager [phone] Office [phone]    Rashanda Magloire 04/02/2019, 3:12 PM

## 2019-04-02 NOTE — Progress Notes (Signed)
Therapy Plan: OPPT DME: Has RW

## 2019-04-02 NOTE — Anesthesia Postprocedure Evaluation (Signed)
Anesthesia Post Note  Patient: Michelle Mosley  Procedure(s) Performed: TOTAL KNEE ARTHROPLASTY (Left Knee)     Patient location during evaluation: PACU Anesthesia Type: Spinal Level of consciousness: awake and alert Pain management: pain level controlled Vital Signs Assessment: post-procedure vital signs reviewed and stable Respiratory status: spontaneous breathing and respiratory function stable Cardiovascular status: blood pressure returned to baseline and stable Postop Assessment: spinal receding and no apparent nausea or vomiting Anesthetic complications: no    Last Vitals:  Vitals:   04/02/19 0558 04/02/19 0953  BP: (!) 147/71 (!) 113/55  Pulse: 74 62  Resp: 16 16  Temp: 36.8 C (!) 36.4 C  SpO2: 100% 99%    Last Pain:  Vitals:   04/02/19 0953  TempSrc: Oral  PainSc:                  Michelle Mosley

## 2019-04-02 NOTE — Plan of Care (Signed)
Pt ready for DC home today 

## 2019-04-03 ENCOUNTER — Encounter (HOSPITAL_COMMUNITY): Payer: Self-pay

## 2019-04-03 ENCOUNTER — Inpatient Hospital Stay (HOSPITAL_COMMUNITY): Payer: Medicare Other

## 2019-04-03 ENCOUNTER — Other Ambulatory Visit: Payer: Self-pay

## 2019-04-03 ENCOUNTER — Observation Stay (HOSPITAL_COMMUNITY)
Admission: EM | Admit: 2019-04-03 | Discharge: 2019-04-04 | Disposition: A | Discharge status: Home / Self Care | Payer: Medicare Other | Attending: Internal Medicine | Admitting: Internal Medicine

## 2019-04-03 DIAGNOSIS — N132 Hydronephrosis with renal and ureteral calculous obstruction: Secondary | ICD-10-CM | POA: Diagnosis not present

## 2019-04-03 DIAGNOSIS — I700 Atherosclerosis of aorta: Secondary | ICD-10-CM | POA: Diagnosis not present

## 2019-04-03 DIAGNOSIS — Z7982 Long term (current) use of aspirin: Secondary | ICD-10-CM | POA: Diagnosis not present

## 2019-04-03 DIAGNOSIS — Z6825 Body mass index (BMI) 25.0-25.9, adult: Secondary | ICD-10-CM | POA: Insufficient documentation

## 2019-04-03 DIAGNOSIS — R55 Syncope and collapse: Secondary | ICD-10-CM

## 2019-04-03 DIAGNOSIS — R52 Pain, unspecified: Secondary | ICD-10-CM | POA: Diagnosis not present

## 2019-04-03 DIAGNOSIS — M1990 Unspecified osteoarthritis, unspecified site: Secondary | ICD-10-CM | POA: Diagnosis not present

## 2019-04-03 DIAGNOSIS — E669 Obesity, unspecified: Secondary | ICD-10-CM | POA: Diagnosis not present

## 2019-04-03 DIAGNOSIS — I10 Essential (primary) hypertension: Secondary | ICD-10-CM | POA: Insufficient documentation

## 2019-04-03 DIAGNOSIS — Z96652 Presence of left artificial knee joint: Secondary | ICD-10-CM | POA: Insufficient documentation

## 2019-04-03 DIAGNOSIS — Z20828 Contact with and (suspected) exposure to other viral communicable diseases: Secondary | ICD-10-CM | POA: Insufficient documentation

## 2019-04-03 DIAGNOSIS — R110 Nausea: Secondary | ICD-10-CM | POA: Diagnosis not present

## 2019-04-03 DIAGNOSIS — R42 Dizziness and giddiness: Secondary | ICD-10-CM | POA: Diagnosis not present

## 2019-04-03 DIAGNOSIS — E871 Hypo-osmolality and hyponatremia: Secondary | ICD-10-CM

## 2019-04-03 DIAGNOSIS — Z66 Do not resuscitate: Secondary | ICD-10-CM | POA: Diagnosis not present

## 2019-04-03 DIAGNOSIS — I959 Hypotension, unspecified: Secondary | ICD-10-CM | POA: Diagnosis not present

## 2019-04-03 DIAGNOSIS — N1330 Unspecified hydronephrosis: Secondary | ICD-10-CM | POA: Insufficient documentation

## 2019-04-03 DIAGNOSIS — Z23 Encounter for immunization: Secondary | ICD-10-CM | POA: Insufficient documentation

## 2019-04-03 DIAGNOSIS — D649 Anemia, unspecified: Principal | ICD-10-CM | POA: Diagnosis present

## 2019-04-03 DIAGNOSIS — Z79899 Other long term (current) drug therapy: Secondary | ICD-10-CM | POA: Diagnosis not present

## 2019-04-03 DIAGNOSIS — Z86718 Personal history of other venous thrombosis and embolism: Secondary | ICD-10-CM | POA: Diagnosis not present

## 2019-04-03 DIAGNOSIS — Z03818 Encounter for observation for suspected exposure to other biological agents ruled out: Secondary | ICD-10-CM | POA: Diagnosis not present

## 2019-04-03 DIAGNOSIS — Z86711 Personal history of pulmonary embolism: Secondary | ICD-10-CM | POA: Diagnosis not present

## 2019-04-03 HISTORY — DX: Acute embolism and thrombosis of unspecified deep veins of unspecified lower extremity: I82.409

## 2019-04-03 LAB — FOLATE: Folate: 15.8 ng/mL (ref >5.9)

## 2019-04-03 LAB — CBC WITH DIFFERENTIAL/PLATELET
Abs Immature Granulocytes: 0.07 10*3/uL (ref 0.00–0.07)
Basophils Absolute: 0 10*3/uL (ref 0.0–0.1)
Basophils Relative: 0 %
Eosinophils Absolute: 0 10*3/uL (ref 0.0–0.5)
Eosinophils Relative: 0 %
HCT: 23.2 % — ABNORMAL LOW (ref 36.0–46.0)
Hemoglobin: 7.6 g/dL — ABNORMAL LOW (ref 12.0–15.0)
Immature Granulocytes: 1 %
Lymphocytes Relative: 12 %
Lymphs Abs: 1.2 10*3/uL (ref 0.7–4.0)
MCH: 32.3 pg (ref 26.0–34.0)
MCHC: 32.8 g/dL (ref 30.0–36.0)
MCV: 98.7 fL (ref 80.0–100.0)
Monocytes Absolute: 1.3 10*3/uL — ABNORMAL HIGH (ref 0.1–1.0)
Monocytes Relative: 13 %
Neutro Abs: 7.4 10*3/uL (ref 1.7–7.7)
Neutrophils Relative %: 74 %
Platelets: 181 10*3/uL (ref 150–400)
RBC: 2.35 MIL/uL — ABNORMAL LOW (ref 3.87–5.11)
RDW: 13.3 % (ref 11.5–15.5)
WBC: 10 10*3/uL (ref 4.0–10.5)
nRBC: 0 % (ref 0.0–0.2)

## 2019-04-03 LAB — BASIC METABOLIC PANEL
Anion gap: 8 (ref 5–15)
BUN: 15 mg/dL (ref 8–23)
CO2: 22 mmol/L (ref 22–32)
Calcium: 7.2 mg/dL — ABNORMAL LOW (ref 8.9–10.3)
Chloride: 102 mmol/L (ref 98–111)
Creatinine, Ser: 0.45 mg/dL (ref 0.44–1.00)
GFR calc Af Amer: >60 mL/min (ref >60)
GFR calc non Af Amer: >60 mL/min (ref >60)
Glucose, Bld: 96 mg/dL (ref 70–99)
Potassium: 3.5 mmol/L (ref 3.5–5.1)
Sodium: 132 mmol/L — ABNORMAL LOW (ref 135–145)

## 2019-04-03 LAB — IRON AND TIBC
Iron: 18 ug/dL — ABNORMAL LOW (ref 28–170)
Saturation Ratios: 7 % — ABNORMAL LOW (ref 10.4–31.8)
TIBC: 250 ug/dL (ref 250–450)
UIBC: 232 ug/dL

## 2019-04-03 LAB — RETICULOCYTES
Immature Retic Fract: 11.5 % (ref 2.3–15.9)
RBC.: 2.63 MIL/uL — ABNORMAL LOW (ref 3.87–5.11)
Retic Count, Absolute: 81.3 10*3/uL (ref 19.0–186.0)
Retic Ct Pct: 3.1 % (ref 0.4–3.1)

## 2019-04-03 LAB — FERRITIN: Ferritin: 98 ng/mL (ref 11–307)

## 2019-04-03 LAB — POC OCCULT BLOOD, ED: Fecal Occult Bld: NEGATIVE

## 2019-04-03 LAB — VITAMIN B12: Vitamin B-12: 304 pg/mL (ref 180–914)

## 2019-04-03 LAB — PREPARE RBC (CROSSMATCH)

## 2019-04-03 LAB — CBG MONITORING, ED: Glucose-Capillary: 98 mg/dL (ref 70–99)

## 2019-04-03 MED ORDER — SODIUM CHLORIDE (PF) 0.9 % IJ SOLN
INTRAMUSCULAR | Status: AC
  Filled 2019-04-03: qty 50

## 2019-04-03 MED ORDER — CARVEDILOL 6.25 MG PO TABS
6.2500 mg | ORAL_TABLET | Freq: Two times a day (BID) | ORAL | Status: DC
  Administered 2019-04-03 – 2019-04-04 (×2): 6.25 mg via ORAL
  Filled 2019-04-03 (×3): qty 1

## 2019-04-03 MED ORDER — VITAMIN D 25 MCG (1000 UNIT) PO TABS
2000.0000 [IU] | ORAL_TABLET | Freq: Every day | ORAL | Status: DC
  Administered 2019-04-04: 2000 [IU] via ORAL
  Filled 2019-04-03: qty 2

## 2019-04-03 MED ORDER — SODIUM CHLORIDE 0.9% FLUSH
3.0000 mL | Freq: Two times a day (BID) | INTRAVENOUS | Status: DC
  Administered 2019-04-03 – 2019-04-04 (×2): 3 mL via INTRAVENOUS

## 2019-04-03 MED ORDER — DOCUSATE SODIUM 100 MG PO CAPS
100.0000 mg | ORAL_CAPSULE | Freq: Two times a day (BID) | ORAL | Status: DC
  Administered 2019-04-04: 100 mg via ORAL
  Filled 2019-04-03 (×2): qty 1

## 2019-04-03 MED ORDER — HYDROCODONE-ACETAMINOPHEN 5-325 MG PO TABS
1.0000 | ORAL_TABLET | Freq: Four times a day (QID) | ORAL | Status: DC | PRN
  Administered 2019-04-03 – 2019-04-04 (×2): 1 via ORAL
  Administered 2019-04-04: 2 via ORAL
  Filled 2019-04-03 (×2): qty 1
  Filled 2019-04-03: qty 2

## 2019-04-03 MED ORDER — ADULT MULTIVITAMIN W/MINERALS CH
1.0000 | ORAL_TABLET | Freq: Every day | ORAL | Status: DC
  Administered 2019-04-04: 1 via ORAL
  Filled 2019-04-03: qty 1

## 2019-04-03 MED ORDER — INFLUENZA VAC A&B SA ADJ QUAD 0.5 ML IM PRSY
0.5000 mL | PREFILLED_SYRINGE | INTRAMUSCULAR | Status: AC
  Administered 2019-04-04: 0.5 mL via INTRAMUSCULAR
  Filled 2019-04-03: qty 0.5

## 2019-04-03 MED ORDER — ASPIRIN 81 MG PO CHEW
81.0000 mg | CHEWABLE_TABLET | Freq: Two times a day (BID) | ORAL | Status: DC
  Administered 2019-04-03 – 2019-04-04 (×2): 81 mg via ORAL
  Filled 2019-04-03 (×2): qty 1

## 2019-04-03 MED ORDER — SODIUM CHLORIDE 0.9 % IV SOLN
10.0000 mL/h | Freq: Once | INTRAVENOUS | Status: AC
  Administered 2019-04-03: 10 mL/h via INTRAVENOUS

## 2019-04-03 MED ORDER — POLYETHYLENE GLYCOL 3350 17 G PO PACK
17.0000 g | PACK | Freq: Two times a day (BID) | ORAL | Status: DC
  Administered 2019-04-03 – 2019-04-04 (×2): 17 g via ORAL
  Filled 2019-04-03 (×2): qty 1

## 2019-04-03 MED ORDER — FERROUS SULFATE 325 (65 FE) MG PO TABS
325.0000 mg | ORAL_TABLET | Freq: Three times a day (TID) | ORAL | Status: DC
  Administered 2019-04-04 (×2): 325 mg via ORAL
  Filled 2019-04-03 (×2): qty 1

## 2019-04-03 MED ORDER — IOHEXOL 350 MG/ML SOLN
100.0000 mL | Freq: Once | INTRAVENOUS | Status: AC | PRN
  Administered 2019-04-03: 100 mL via INTRAVENOUS

## 2019-04-03 MED ORDER — SODIUM CHLORIDE 0.9 % IV BOLUS
500.0000 mL | Freq: Once | INTRAVENOUS | Status: AC
  Administered 2019-04-03: 11:00:00 500 mL via INTRAVENOUS

## 2019-04-03 NOTE — ED Triage Notes (Signed)
Per EMS, patient was at her sons house participating with  PT. patient had syncopal episode while sitting in chair. Patient is s/p knee replacement surgery. Surgery was 9/17. Patient took hydrocodone and muscle relaxer along with coreg at 0730 this am.

## 2019-04-03 NOTE — ED Provider Notes (Signed)
[facility] DEPT Provider Note   CSN: [id number] Arrival date & time: 04/03/19  1042     History   Chief Complaint Chief Complaint  Patient presents with  . Hypotension    syncope    HPI Michelle Mosley is a 85 y.o. female.     The history is provided by the patient and a caregiver.  Loss of Consciousness Episode history:  Single Most recent episode:  Today Progression:  Resolved Chronicity:  New Context: dehydration, medication change and sitting down   Witnessed: yes   Relieved by:  Bed rest (500 cc with EMS) Associated symptoms: no anxiety, no chest pain, no dizziness, no fever, no focal weakness, no headaches, no malaise/fatigue, no nausea, no palpitations, no seizures, no shortness of breath, no visual change and no vomiting   Risk factors: no coronary artery disease and no seizures   Risk factors comment:  Recent left knee surgery with new pain medications.   Past Medical History:  Diagnosis Date  . Arthritis   . Hypertension     Patient Active Problem List   Diagnosis Date Noted  . Syncope 04/03/2019  . Normocytic anemia 04/03/2019  . Hyponatremia 04/03/2019  . Overweight (BMI 25.0-29.9) 04/02/2019  . S/P left TKA 04/01/2019    Past Surgical History:  Procedure Laterality Date  . broken neck  1976  . [place]  . excision of breast cyst Left   . TOTAL KNEE ARTHROPLASTY Left 04/01/2019   Procedure: TOTAL KNEE ARTHROPLASTY;  Surgeon: [name], MD;  Location: [facility];  Service: Orthopedics;  Laterality: Left;  70 mins     OB History   No obstetric history on file.      Home Medications    Prior to Admission medications   Medication Sig Start Date End Date Taking? Authorizing Provider  amLODipine (NORVASC) 5 MG tablet Take 5 mg by mouth daily.    Yes [provider]  aspirin (ASPIRIN CHILDRENS) 81 MG chewable tablet Chew 1 tablet (81 mg total) by mouth 2 (two) times daily. Take for 4  weeks, then resume regular dose. 04/03/19 05/03/19 Yes Babish, Rodman Key, PA-C  carvedilol (COREG) 6.25 MG tablet Take 6.25 mg by mouth 2 (two) times daily with a meal.   Yes [provider]  Cholecalciferol (VITAMIN D) 2000 units CAPS Take by mouth daily.   Yes [provider]  docusate sodium (COLACE) 100 MG capsule Take 1 capsule (100 mg total) by mouth 2 (two) times daily. 04/02/19  Yes Babish, Rodman Key, PA-C  ferrous sulfate (FERROUSUL) 325 (65 FE) MG tablet Take 1 tablet (325 mg total) by mouth 3 (three) times daily with meals for 14 days. 04/02/19 04/16/19 Yes Babish, Rodman Key, PA-C  HYDROcodone-acetaminophen (NORCO) 5-325 MG tablet Take 1-2 tablets by mouth every 6 (six) hours as needed for moderate pain or severe pain. 04/02/19  Yes Babish, Rodman Key, PA-C  methocarbamol (ROBAXIN) 500 MG tablet Take 1 tablet (500 mg total) by mouth every 6 (six) hours as needed for muscle spasms. 04/02/19  Yes Babish, Rodman Key, PA-C  Multiple Vitamin (MULTIVITAMIN) tablet Take 1 tablet by mouth daily.   Yes [provider]  polyethylene glycol (MIRALAX / GLYCOLAX) 17 g packet Take 17 g by mouth 2 (two) times daily. 04/02/19  Yes Babish, Rodman Key, PA-C  sodium chloride ([employer]S SODIUM CHLORIDE) 5 % ophthalmic solution Place 1 drop into the right eye every 4 (four) hours as needed for eye irritation.   Yes [provider]  valsartan-hydrochlorothiazide (DIOVAN-HCT) 320-12.5 MG per tablet Take 1 tablet by mouth daily.   Yes [provider]  White Petrolatum-Mineral Oil (CVS EYE LUBRICANT) OINT Place 1 drop into both eyes daily.   Yes [provider]    Family History Family History  Problem Relation Age of Onset  . Colon cancer Mother     Social History Social History   Tobacco Use  . Smoking status: Never Smoker  . Smokeless tobacco: Never Used  Substance Use Topics  . Alcohol use: No  . Drug use: Never     Allergies   Patient has no known allergies.    Review of Systems Review of Systems  Constitutional: Negative for chills, fever and malaise/fatigue.  HENT: Negative for ear pain and sore throat.   Eyes: Negative for pain and visual disturbance.  Respiratory: Negative for cough and shortness of breath.   Cardiovascular: Positive for syncope. Negative for chest pain and palpitations.  Gastrointestinal: Negative for abdominal pain, nausea and vomiting.  Genitourinary: Negative for dysuria and hematuria.  Musculoskeletal: Negative for arthralgias and back pain.  Skin: Negative for color change and rash.  Neurological: Positive for syncope. Negative for dizziness, focal weakness, seizures and headaches.  All other systems reviewed and are negative.    Physical Exam Updated Vital Signs  ED Triage Vitals  Enc Vitals Group     BP 04/03/19 1132 (!) 136/58     Pulse Rate 04/03/19 1132 87     Resp 04/03/19 1132 17     Temp 04/03/19 1132 98.8 F (37.1 C)     Temp Source 04/03/19 1132 Oral     SpO2 04/03/19 1132 100 %     Weight 04/03/19 1052 150 lb (68 kg)     Height 04/03/19 1052 5\' 5"  (1.651 m)     Head Circumference --      Peak Flow --      Pain Score 04/03/19 1052 0     Pain Loc --      Pain Edu? --      Excl. in [place]? --     Physical Exam Vitals signs and nursing note reviewed.  Constitutional:      General: She is not in acute distress.    Appearance: She is well-developed. She is not ill-appearing.  HENT:     Head: Normocephalic and atraumatic.     Nose: Nose normal.     Mouth/Throat:     Mouth: Mucous membranes are moist.  Eyes:     Extraocular Movements: Extraocular movements intact.     Conjunctiva/sclera: Conjunctivae normal.     Pupils: Pupils are equal, round, and reactive to light.  Neck:     Musculoskeletal: Normal range of motion and neck supple.  Cardiovascular:     Rate and Rhythm: Normal rate and regular rhythm.     Pulses: Normal pulses.     Heart sounds: Normal heart sounds. No murmur.  Pulmonary:      Effort: Pulmonary effort is normal. No respiratory distress.     Breath sounds: Normal breath sounds.  Abdominal:     Palpations: Abdomen is soft.     Tenderness: There is no abdominal tenderness.  Skin:    General: Skin is warm and dry.     Capillary Refill: Capillary refill takes less than 2 seconds.  Neurological:     General: No focal deficit present.     Mental Status: She is alert and oriented to person, place, and  time.     Cranial Nerves: No cranial nerve deficit.     Sensory: No sensory deficit.     Motor: No weakness.     Coordination: Coordination normal.      ED Treatments / Results  Labs (all labs ordered are listed, but only abnormal results are displayed) Labs Reviewed  CBC WITH DIFFERENTIAL/PLATELET - Abnormal; Notable for the following components:      Result Value   RBC 2.35 (*)    Hemoglobin 7.6 (*)    HCT 23.2 (*)    Monocytes Absolute 1.3 (*)    All other components within normal limits  BASIC METABOLIC PANEL - Abnormal; Notable for the following components:   Sodium 132 (*)    Calcium 7.2 (*)    All other components within normal limits  RETICULOCYTES - Abnormal; Notable for the following components:   RBC. 2.63 (*)    All other components within normal limits  NOVEL CORONAVIRUS, NAA (HOSP ORDER, SEND-OUT TO REF LAB; TAT 18-24 HRS)  VITAMIN B12  FOLATE  IRON AND TIBC  FERRITIN  CBG MONITORING, ED  POC OCCULT BLOOD, ED  TYPE AND SCREEN  PREPARE RBC (CROSSMATCH)    EKG EKG Interpretation  Date/Time:  Saturday April 03 2019 11:13:11 EDT Ventricular Rate:  77 PR Interval:    QRS Duration: 94 QT Interval:  346 QTC Calculation: 392 R Axis:   62 Text Interpretation:  Sinus rhythm RSR' in V1 or V2, probably normal variant Confirmed by Lennice Sites [phone]) on 04/03/2019 11:29:05 AM   Radiology No results found.  Procedures .Critical Care Performed by: Lennice Sites, DO Authorized by: Lennice Sites, DO   Critical care  provider statement:    Critical care time (minutes):  45   Critical care was necessary to treat or prevent imminent or life-threatening deterioration of the following conditions:  Circulatory failure   Critical care was time spent personally by me on the following activities:  Blood draw for specimens, development of treatment plan with patient or surrogate, discussions with consultants, discussions with primary provider, evaluation of patient's response to treatment, examination of patient, obtaining history from patient or surrogate, ordering and performing treatments and interventions, ordering and review of laboratory studies, ordering and review of radiographic studies, pulse oximetry, review of old charts and re-evaluation of patient's condition   I assumed direction of critical care for this patient from another provider in my specialty: no     (including critical care time)  Medications Ordered in ED Medications  0.9 %  sodium chloride infusion (has no administration in time range)  sodium chloride 0.9 % bolus 500 mL (0 mLs Intravenous Stopped 04/03/19 1250)     Initial Impression / Assessment and Plan / ED Course  I have reviewed the triage vital signs and the nursing notes.  Pertinent labs & imaging results that were available during my care of the patient were reviewed by me and considered in my medical decision making (see chart for details).     JAKE ZARRELLA is an 85 year old female with history of hypertension, arthritis who presents to the ED after syncopal event.  Patient with recent left knee replacement.  Patient with normal vitals.  No fever.  Has syncopal event while doing home exercises on her left knee.  Had surgery 2 days ago.  Patient per EMS had a blood pressure 66/46.  Blood pressure has improved following fluid bolus.  Patient with normal vitals, no fever.  Normal neurological exam.  Does  not have any abdominal pain.  No chest pain, no shortness of breath.  Patient  with surgical site looks clean dry and intact.  She has new prescription for narcotic pain medicine and muscle relaxant and possible this was a vasovagal episode.  Patient does appear slightly pale on exam and could be due to anemia or electrolyte abnormality or dehydration.  Hemoglobin is 7.6.  Otherwise lab work is unremarkable.  BMP shows no significant electrolyte abnormality, kidney injury, leukocytosis.  Overall believe patient has symptomatic anemia.  Discharge hemoglobin yesterday was 8.8.  Hemoglobin 2 weeks ago was 12.6.  Suspect that this is loss from surgery.  Hemoccult was done that showed brown stool.  No other obvious source for bleeding except for surgical loss.  Will type and screen and give 1 unit of packed red blood cells.  Contacted orthopedics and they are aware but do not recommend any specific work-up.  Recommend admission to hospitalist which was done.  Anemia panel was ordered.  Patient admitted to medicine for further care.  Hemodynamically stable throughout my care.  This chart was dictated using voice recognition software.  Despite best efforts to proofread,  errors can occur which can change the documentation meaning.    Final Clinical Impressions(s) / ED Diagnoses   Final diagnoses:  Symptomatic anemia    ED Discharge Orders    None       Lennice Sites, DO 04/03/19 1523

## 2019-04-03 NOTE — ED Triage Notes (Signed)
Ems reports patients initial bp was 66/46, after 500 bolus, pressure was 143/89

## 2019-04-03 NOTE — H&P (Signed)
.  History and Physical    Michelle Mosley [id number] DOB: 01-05-1933 DOA: 04/03/2019  PCP: Lajean Manes, MD  Patient coming from: Home   Chief Complaint: syncope  HPI: Michelle Mosley is a 85 y.o. female with medical history significant of DVT, recent L total knee. Present with 5 minute episode of syncope today during therapy. She reports that she was working on some leg lifts with family and she felt lightheaded. She passed out and doesn't remember anything until she came to 5 minutes later. Her son at bedside says he was able to catch her in her chair. There was no head injury or fall. She was out for 5 minutes before responding to sternal rubs. When she came to, he noticed a slight drawing of her left face. EMS arrived and found her to be hypotensive. They gave fluids at the scene and she responded well. She was transported to the ED. She does not identify any aggravating or alleviating factors.     ED Course: In the ED, she was found to have a 1.3 point drop in the Hgb from the discharge the day before. Fecal studies were negative. Her knee looked fine. She was ordered 1 unit of pRBCs. TRH was called for admission.   Review of Systems: Denies CP, dyspnea, HA, ab pain, N, V, hematuria, hematochezia. Remainder of 10 point review of systems is otherwise negative for all not mentioned in HPI.    Past Medical History:  Diagnosis Date  . Arthritis   . Hypertension     Past Surgical History:  Procedure Laterality Date  . broken neck  1976  . [place]  . excision of breast cyst Left   . TOTAL KNEE ARTHROPLASTY Left 04/01/2019   Procedure: TOTAL KNEE ARTHROPLASTY;  Surgeon: [name], MD;  Location: [facility];  Service: Orthopedics;  Laterality: Left;  70 mins     reports that she has never smoked. She has never used smokeless tobacco. She reports that she does not drink alcohol or use drugs.  No Known Allergies  Family History  Problem Relation Age of Onset   . Colon cancer Mother     Prior to Admission medications   Medication Sig Start Date End Date Taking? Authorizing Provider  amLODipine (NORVASC) 5 MG tablet Take 5 mg by mouth daily.    Yes [provider]  aspirin (ASPIRIN CHILDRENS) 81 MG chewable tablet Chew 1 tablet (81 mg total) by mouth 2 (two) times daily. Take for 4 weeks, then resume regular dose. 04/03/19 05/03/19 Yes Babish, Rodman Key, PA-C  carvedilol (COREG) 6.25 MG tablet Take 6.25 mg by mouth 2 (two) times daily with a meal.   Yes [provider]  Cholecalciferol (VITAMIN D) 2000 units CAPS Take by mouth daily.   Yes [provider]  docusate sodium (COLACE) 100 MG capsule Take 1 capsule (100 mg total) by mouth 2 (two) times daily. 04/02/19  Yes Babish, Rodman Key, PA-C  ferrous sulfate (FERROUSUL) 325 (65 FE) MG tablet Take 1 tablet (325 mg total) by mouth 3 (three) times daily with meals for 14 days. 04/02/19 04/16/19 Yes Babish, Rodman Key, PA-C  HYDROcodone-acetaminophen (NORCO) 5-325 MG tablet Take 1-2 tablets by mouth every 6 (six) hours as needed for moderate pain or severe pain. 04/02/19  Yes Babish, Rodman Key, PA-C  methocarbamol (ROBAXIN) 500 MG tablet Take 1 tablet (500 mg total) by mouth every 6 (six) hours as needed for muscle spasms. 04/02/19  Yes Danae Orleans, PA-C  Multiple Vitamin (MULTIVITAMIN) tablet Take 1 tablet by mouth daily.   Yes [provider]  polyethylene glycol (MIRALAX / GLYCOLAX) 17 g packet Take 17 g by mouth 2 (two) times daily. 04/02/19  Yes Babish, Rodman Key, PA-C  sodium chloride ([employer]S SODIUM CHLORIDE) 5 % ophthalmic solution Place 1 drop into the right eye every 4 (four) hours as needed for eye irritation.   Yes [provider]  valsartan-hydrochlorothiazide (DIOVAN-HCT) 320-12.5 MG per tablet Take 1 tablet by mouth daily.   Yes [provider]  White Petrolatum-Mineral Oil ([employer]S EYE LUBRICANT) OINT Place 1 drop into both eyes daily.   Yes [provider]    Physical Exam: Vitals:   04/03/19 1052 04/03/19 1132  BP:  (!) 136/58  Pulse:  87  Resp:  17  Temp:  98.8 F (37.1 C)  TempSrc:  Oral  SpO2:  100%  Weight: 68 kg   Height: 5\' 5"  (1.651 m)     Constitutional: 85 y.o. female NAD, calm, comfortable Vitals:   04/03/19 1052 04/03/19 1132  BP:  (!) 136/58  Pulse:  87  Resp:  17  Temp:  98.8 F (37.1 C)  TempSrc:  Oral  SpO2:  100%  Weight: 68 kg   Height: 5\' 5"  (1.651 m)    General: 85 y.o. female resting in bed in NAD Eyes: PERRL, normal sclera ENMT: Nares patent w/o discharge, orophaynx clear, dentition normal, ears w/o discharge/lesions/ulcers Cardiovascular: RRR, +S1, S2, no m/g/r, equal pulses throughout Respiratory: CTABL, no w/r/r, normal WOB GI: BS+, NDNT, no masses noted, no organomegaly noted MSK: No e/c/c; L knee bandaging CDI, minimal swelling Skin: No rashes, bruises, ulcerations noted Neuro: A&O x 3, no focal deficits Psyc: Appropriate interaction and affect, calm/cooperative    Labs on Admission: I have personally reviewed following labs and imaging studies  CBC: Recent Labs  Lab 04/02/19 0243 04/03/19 1106  WBC 9.5 10.0  NEUTROABS  --  7.4  HGB 8.9* 7.6*  HCT 27.3* 23.2*  MCV 96.8 98.7  PLT 177 0000000   Basic Metabolic Panel: Recent Labs  Lab 04/01/19 0822 04/02/19 0243 04/03/19 1106  NA 134* 130* 132*  K 3.5 4.1 3.5  CL 98 99 102  CO2 26 23 22   GLUCOSE 108* 128* 96  BUN 14 17 15   CREATININE 0.66 0.67 0.45  CALCIUM 9.0 8.2* 7.2*   GFR: Estimated Creatinine Clearance: 47.1 mL/min (by C-G formula based on SCr of 0.45 mg/dL). Liver Function Tests: No results for input(s): AST, ALT, ALKPHOS, BILITOT, PROT, ALBUMIN in the last 168 hours. No results for input(s): LIPASE, AMYLASE in the last 168 hours. No results for input(s): AMMONIA in the last 168 hours. Coagulation Profile: No results for input(s): INR, PROTIME in the last 168 hours. Cardiac Enzymes: No results for input(s):  CKTOTAL, CKMB, CKMBINDEX, TROPONINI in the last 168 hours. BNP (last 3 results) No results for input(s): PROBNP in the last 8760 hours. HbA1C: No results for input(s): HGBA1C in the last 72 hours. CBG: Recent Labs  Lab 04/03/19 1116  GLUCAP 98   Lipid Profile: No results for input(s): CHOL, HDL, LDLCALC, TRIG, CHOLHDL, LDLDIRECT in the last 72 hours. Thyroid Function Tests: No results for input(s): TSH, T4TOTAL, FREET4, T3FREE, THYROIDAB in the last 72 hours. Anemia Panel: Recent Labs    04/03/19 1443  RETICCTPCT 3.1   Urine analysis:    Component Value Date/Time   COLORURINE YELLOW 09/20/2009 1520   APPEARANCEUR CLOUDY (A) 09/20/2009 1520  LABSPEC 1.014 09/20/2009 1520   PHURINE 6.5 09/20/2009 1520   GLUCOSEU NEGATIVE 09/20/2009 1520   HGBUR NEGATIVE 09/20/2009 [place] 09/20/2009 1520   KETONESUR NEGATIVE 09/20/2009 1520   PROTEINUR NEGATIVE 09/20/2009 1520   UROBILINOGEN 0.2 09/20/2009 1520   NITRITE NEGATIVE 09/20/2009 1520   LEUKOCYTESUR TRACE (A) 09/20/2009 1520    Radiological Exams on Admission: No results found.  EKG: Independently reviewed.  Assessment/Plan Principal Problem:   Syncope Active Problems:   Normocytic anemia   Hyponatremia   Syncope Normocytic anemia; symptomatic?     - reportedly lost consciousness for about 5 minutes today while during therapy for her leg; no head injury, was caught in chair by son; required sternal rub to wake     - EMS called and found to be hypotensive     - Lab work shows Hgb 7.6 (down from 8.9 yesterday and 12.6 nine days ago)     - negative FOBT, left knee without swelling     - was presumed to have iron deficiency, but I don't see iron studies; order prior to blood transfusion.     - 1 unit pRBCs ordered in ED     - denies any chest pain, dyspnea, abdominal pain     - echo     - family reports possible drawing of face when she passed out; check CT head.     - has history of RLE DVT  treated only with compression stockings; check CTA chest     - it is strange to lose almost 5 grams of blood w/o obvious bleed, but she has no signs of it; last place to check is abdomen (but no ab pain); check CT ab/pelvis     - currently A&O x 3 with normal neurological exam     - admit inpatient for syncope w/u  Hyponatremia     - mild, 132; monitor  DVT prophylaxis: None d/t bleed  Code Status: DNR  Family Communication: With son at bedside  Disposition Plan: TBD  Consults called: None  Admission status: Inpatient.    Jonnie Finner DO [facility] Pager [phone]  If 7PM-7AM, please contact night-coverage [email or web address] Password [facility]  04/03/2019, 3:14 PM

## 2019-04-03 NOTE — ED Notes (Signed)
ED TO INPATIENT HANDOFF REPORT  ED Nurse Name and Phone #:  Lavette Yankovich RN   S Name/Age/Gender Michelle Mosley 85 y.o. female Room/Bed: WA02/WA02  Code Status   Code Status: DNR  Home/SNF/Other Home Patient oriented to: self, place, time and situation Is this baseline? Yes   Triage Complete: Triage complete  Chief Complaint Hypotension  Triage Note Per EMS, patient was at her sons house participating with  PT. patient had syncopal episode while sitting in chair. Patient is s/p knee replacement surgery. Surgery was 9/17. Patient took hydrocodone and muscle relaxer along with coreg at 0730 this am.     Ems reports patients initial bp was 66/46, after 500 bolus, pressure was 143/89   Allergies No Known Allergies  Level of Care/Admitting Diagnosis ED Disposition    ED Disposition Condition Comment   Admit  Hospital Area: [facility] [[id number]]  Level of Care: Telemetry [5]  Admit to tele based on following criteria: Eval of Syncope  Covid Evaluation: Asymptomatic Screening Protocol (No Symptoms)  Diagnosis: Syncope [id number]  Admitting Physician: Jonnie Finner [id number]  Attending Physician: Jonnie Finner [id number]  Estimated length of stay: past midnight tomorrow  Certification:: I certify this patient will need inpatient services for at least 2 midnights  PT Class (Do Not Modify): Inpatient [101]  PT Acc Code (Do Not Modify): Private [1]       B Medical/Surgery History Past Medical History:  Diagnosis Date  . Arthritis   . Hypertension    Past Surgical History:  Procedure Laterality Date  . broken neck  1976  . [place]  . excision of breast cyst Left   . TOTAL KNEE ARTHROPLASTY Left 04/01/2019   Procedure: TOTAL KNEE ARTHROPLASTY;  Surgeon: [name], MD;  Location: [facility];  Service: Orthopedics;  Laterality: Left;  70 mins     A IV Location/Drains/Wounds Patient Lines/Drains/Airways Status   Active  Line/Drains/Airways    Name:   Placement date:   Placement time:   Site:   Days:   Peripheral IV 04/03/19 Anterior;Proximal;Right Forearm   04/03/19    1041    Forearm   less than 1   Incision (Closed) 04/01/19 Knee Left   04/01/19    0935     2          Intake/Output Last 24 hours No intake or output data in the 24 hours ending 04/03/19 1625  Labs/Imaging Results for orders placed or performed during the hospital encounter of 04/03/19 (from the past 48 hour(s))  CBC with Differential     Status: Abnormal   Collection Time: 04/03/19 11:06 AM  Result Value Ref Range   WBC 10.0 4.0 - 10.5 K/uL   RBC 2.35 (L) 3.87 - 5.11 MIL/uL   Hemoglobin 7.6 (L) 12.0 - 15.0 g/dL   HCT 23.2 (L) 36.0 - 46.0 %   MCV 98.7 80.0 - 100.0 fL   MCH 32.3 26.0 - 34.0 pg   MCHC 32.8 30.0 - 36.0 g/dL   RDW 13.3 11.5 - 15.5 %   Platelets 181 150 - 400 K/uL   nRBC 0.0 0.0 - 0.2 %   Neutrophils Relative % 74 %   Neutro Abs 7.4 1.7 - 7.7 K/uL   Lymphocytes Relative 12 %   Lymphs Abs 1.2 0.7 - 4.0 K/uL   Monocytes Relative 13 %   Monocytes Absolute 1.3 (H) 0.1 - 1.0 K/uL   Eosinophils Relative 0 %   Eosinophils  Absolute 0.0 0.0 - 0.5 K/uL   Basophils Relative 0 %   Basophils Absolute 0.0 0.0 - 0.1 K/uL   Immature Granulocytes 1 %   Abs Immature Granulocytes 0.07 0.00 - 0.07 K/uL    Comment: Performed at [facility], [place] [address], [place], [place] [id number]  Basic metabolic panel     Status: Abnormal   Collection Time: 04/03/19 11:06 AM  Result Value Ref Range   Sodium 132 (L) 135 - 145 mmol/L   Potassium 3.5 3.5 - 5.1 mmol/L   Chloride 102 98 - 111 mmol/L   CO2 22 22 - 32 mmol/L   Glucose, Bld 96 70 - 99 mg/dL   BUN 15 8 - 23 mg/dL   Creatinine, Ser 0.45 0.44 - 1.00 mg/dL   Calcium 7.2 (L) 8.9 - 10.3 mg/dL   GFR calc non Af Amer >60 >60 mL/min   GFR calc Af Amer >60 >60 mL/min   Anion gap 8 5 - 15    Comment: Performed at [facility], [place] [address],  [place], [place] [address]  CBG monitoring, ED     Status: None   Collection Time: 04/03/19 11:16 AM  Result Value Ref Range   Glucose-Capillary 98 70 - 99 mg/dL  POC occult blood, ED     Status: None   Collection Time: 04/03/19  1:30 PM  Result Value Ref Range   Fecal Occult Bld NEGATIVE NEGATIVE  Vitamin B12     Status: None   Collection Time: 04/03/19  2:42 PM  Result Value Ref Range   Vitamin B-12 304 180 - 914 pg/mL    Comment: (NOTE) This assay is not validated for testing neonatal or myeloproliferative syndrome specimens for Vitamin B12 levels. Performed at [facility], [place] [address] [place], [place] [address]   Iron and TIBC     Status: Abnormal   Collection Time: 04/03/19  2:42 PM  Result Value Ref Range   Iron 18 (L) 28 - 170 ug/dL   TIBC 250 250 - 450 ug/dL   Saturation Ratios 7 (L) 10.4 - 31.8 %   UIBC 232 ug/dL    Comment: Performed at [facility], [place] [address] [place], [place] [address]  Ferritin     Status: None   Collection Time: 04/03/19  2:42 PM  Result Value Ref Range   Ferritin 98 11 - 307 ng/mL    Comment: Performed at [facility], [place] [address], [place], [place] [address]  Folate     Status: None   Collection Time: 04/03/19  2:43 PM  Result Value Ref Range   Folate 15.8 >5.9 ng/mL    Comment: Performed at [facility], [place] [address] [place], [place] [address]  Reticulocytes     Status: Abnormal   Collection Time: 04/03/19  2:43 PM  Result Value Ref Range   Retic Ct Pct 3.1 0.4 - 3.1 %   RBC. 2.63 (L) 3.87 - 5.11 MIL/uL   Retic Count, Absolute 81.3 19.0 - 186.0 K/uL   Immature Retic Fract 11.5 2.3 - 15.9 %    Comment: Performed at [facility], [place] [address] [place], [place] [address]  Prepare RBC     Status: None   Collection Time: 04/03/19  2:48 PM  Result Value Ref Range   Order Confirmation      ORDER PROCESSED BY BLOOD BANK Performed at  [facility], [place]  [address] [place], [place] [address]   Type and screen [place]     Status: None (Preliminary result)   Collection Time: 04/03/19  2:51 PM  Result Value Ref Range   ABO/RH(D) A POS    Antibody Screen NEG    Sample Expiration 04/06/2019,2359    Unit Number [id number]    Blood Component Type RED CELLS,LR    Unit division 00    Status of Unit ALLOCATED    Transfusion Status OK TO TRANSFUSE    Crossmatch Result      Compatible Performed at [facility], [place] [address] [place], [place] [address]    No results found.  Pending Labs Unresulted Labs (From admission, onward)    Start     Ordered   04/04/19 0500  Comprehensive metabolic panel  Tomorrow morning,   R     04/03/19 1559   04/04/19 0500  CBC  Tomorrow morning,   R     04/03/19 1559   04/04/19 0500  Protime-INR  Tomorrow morning,   R     04/03/19 1559   04/03/19 1347  Novel Coronavirus, NAA (Hosp order, Send-out to Ref Lab; TAT 18-24 hrs  (Asymptomatic/Tier 3)  Once,   STAT    Question Answer Comment  Is this test for diagnosis or screening Screening   Symptomatic for COVID-19 as defined by CDC No   Hospitalized for COVID-19 No   Admitted to ICU for COVID-19 No   Previously tested for COVID-19 No   Resident in a congregate (group) care setting No   Employed in healthcare setting No   Pregnant No      04/03/19 1347          Vitals/Pain Today's Vitals   04/03/19 1052 04/03/19 1132  BP:  (!) 136/58  Pulse:  87  Resp:  17  Temp:  98.8 F (37.1 C)  TempSrc:  Oral  SpO2:  100%  Weight: 68 kg   Height: 5\' 5"  (1.651 m)   PainSc: 0-No pain     Isolation Precautions No active isolations  Medications Medications  0.9 %  sodium chloride infusion (has no administration in time range)  aspirin chewable tablet 81 mg (has no administration in time range)  HYDROcodone-acetaminophen (NORCO/VICODIN) 5-325 MG per tablet 1-2 tablet (has  no administration in time range)  carvedilol (COREG) tablet 6.25 mg (has no administration in time range)  docusate sodium (COLACE) capsule 100 mg (has no administration in time range)  polyethylene glycol (MIRALAX / GLYCOLAX) packet 17 g (has no administration in time range)  cholecalciferol (VITAMIN D3) tablet 2,000 Units (has no administration in time range)  multivitamin with minerals tablet 1 tablet (has no administration in time range)  sodium chloride flush (NS) 0.9 % injection 3 mL (has no administration in time range)  sodium chloride (PF) 0.9 % injection (has no administration in time range)  sodium chloride 0.9 % bolus 500 mL (0 mLs Intravenous Stopped 04/03/19 1250)    Mobility walks with device Moderate fall risk   Focused Assessments    R Recommendations: See Admitting Provider Note  Report given to:   Additional Notes:

## 2019-04-04 ENCOUNTER — Inpatient Hospital Stay (HOSPITAL_BASED_OUTPATIENT_CLINIC_OR_DEPARTMENT_OTHER): Payer: Medicare Other

## 2019-04-04 DIAGNOSIS — E871 Hypo-osmolality and hyponatremia: Secondary | ICD-10-CM | POA: Diagnosis not present

## 2019-04-04 DIAGNOSIS — I361 Nonrheumatic tricuspid (valve) insufficiency: Secondary | ICD-10-CM | POA: Diagnosis not present

## 2019-04-04 DIAGNOSIS — D649 Anemia, unspecified: Secondary | ICD-10-CM | POA: Diagnosis present

## 2019-04-04 DIAGNOSIS — R55 Syncope and collapse: Secondary | ICD-10-CM | POA: Diagnosis not present

## 2019-04-04 LAB — TYPE AND SCREEN
ABO/RH(D): A POS
Antibody Screen: NEGATIVE

## 2019-04-04 LAB — CBC
HCT: 29.8 % — ABNORMAL LOW (ref 36.0–46.0)
Hemoglobin: 9.6 g/dL — ABNORMAL LOW (ref 12.0–15.0)
MCH: 30.8 pg (ref 26.0–34.0)
MCHC: 32.2 g/dL (ref 30.0–36.0)
MCV: 95.5 fL (ref 80.0–100.0)
Platelets: 162 10*3/uL (ref 150–400)
RBC: 3.12 MIL/uL — ABNORMAL LOW (ref 3.87–5.11)
RDW: 14.9 % (ref 11.5–15.5)
WBC: 9.4 10*3/uL (ref 4.0–10.5)
nRBC: 0 % (ref 0.0–0.2)

## 2019-04-04 LAB — NOVEL CORONAVIRUS, NAA (HOSP ORDER, SEND-OUT TO REF LAB; TAT 18-24 HRS): SARS-CoV-2, NAA: NOT DETECTED

## 2019-04-04 LAB — COMPREHENSIVE METABOLIC PANEL
ALT: 13 U/L (ref 0–44)
AST: 20 U/L (ref 15–41)
Albumin: 2.8 g/dL — ABNORMAL LOW (ref 3.5–5.0)
Alkaline Phosphatase: 67 U/L (ref 38–126)
Anion gap: 8 (ref 5–15)
BUN: 9 mg/dL (ref 8–23)
CO2: 26 mmol/L (ref 22–32)
Calcium: 8.3 mg/dL — ABNORMAL LOW (ref 8.9–10.3)
Chloride: 96 mmol/L — ABNORMAL LOW (ref 98–111)
Creatinine, Ser: 0.42 mg/dL — ABNORMAL LOW (ref 0.44–1.00)
GFR calc Af Amer: >60 mL/min (ref >60)
GFR calc non Af Amer: >60 mL/min (ref >60)
Glucose, Bld: 108 mg/dL — ABNORMAL HIGH (ref 70–99)
Potassium: 4.2 mmol/L (ref 3.5–5.1)
Sodium: 130 mmol/L — ABNORMAL LOW (ref 135–145)
Total Bilirubin: 1.1 mg/dL (ref 0.3–1.2)
Total Protein: 5.6 g/dL — ABNORMAL LOW (ref 6.5–8.1)

## 2019-04-04 LAB — GLUCOSE, CAPILLARY: Glucose-Capillary: 96 mg/dL (ref 70–99)

## 2019-04-04 LAB — ECHOCARDIOGRAM COMPLETE
Height: 67 in
Weight: 2522.06 oz

## 2019-04-04 LAB — PROTIME-INR
INR: 1 (ref 0.8–1.2)
Prothrombin Time: 13.2 seconds (ref 11.4–15.2)

## 2019-04-04 NOTE — Care Management CC44 (Signed)
Condition Code 44 Documentation Completed  Patient Details  Name: Michelle Mosley MRN: [id number] Date of Birth: Aug 14, 1932   Condition Code 44 given:  Yes Patient signature on Condition Code 44 notice:  Yes Documentation of 2 MD's agreement:  Yes Code 44 added to claim:  Yes    Joaquin Courts, RN 04/04/2019, 3:15 PM

## 2019-04-04 NOTE — Care Management Obs Status (Signed)
[facility] NOTIFICATION   Patient Details  Name: Michelle Mosley MRN: [id number] Date of Birth: 1933/06/30   Medicare Observation Status Notification Given:  Yes    Joaquin Courts, RN 04/04/2019, 3:15 PM

## 2019-04-04 NOTE — Discharge Summary (Signed)
Physician Discharge Summary  Michelle Mosley [id number] DOB: October 17, 1932 DOA: 04/03/2019  PCP: Lajean Manes, MD  Admit date: 04/03/2019 Discharge date: 04/04/2019  Admitted From: Inpatient Disposition: home  Recommendations for Outpatient Follow-up:  1. Follow up with PCP in 1-2 weeks :  Home Health:No Equipment/Devices:none  Discharge Condition:Stable CODE STATUS:DNR Diet recommendation: Regular healthy diet  Brief/Interim Summary: Michelle Mosley is a 85 y.o. female with medical history significant of DVT, recent L total knee. Present with 5 minute episode of syncope today during therapy. She reports that she was working on some leg lifts with family and she felt lightheaded. She passed out and doesn't remember anything until she came to 5 minutes later. Her son at bedside says he was able to catch her in her chair. There was no head injury or fall. She was out for 5 minutes before responding to sternal rubs. When she came to, he noticed a slight drawing of her left face. EMS arrived and found her to be hypotensive. They gave fluids at the scene and she responded well. She was transported to the ED. She does not identify any aggravating or alleviating factors.     ED Course: In the ED, she was found to have a 1.3 point drop in the Hgb from the discharge the day before. Fecal studies were negative. Her knee looked fine. She was ordered 1 unit of pRBCs. TRH was called for admission.   Hospital course: Syncope Normocytic anemia; symptomatic     - reportedly lost consciousness for about 5 minutes today while during therapy for her leg; no head injury, was caught in chair by son; required sternal rub to wake     - EMS called and found to be hypotensive     - Lab work shows Hgb 7.6 (down from 8.9 yesterday and 12.6 nine days ago)     - negative FOBT, left knee without swelling     - was presumed to have iron deficiency, but I don't see iron studies; order prior to blood transfusion.      - 1 unit pRBCs ordered in ED whicha response to 9.6     - denies any chest pain, dyspnea, abdominal pain     - echo     -CT  a/p neg     - CTA neg     - CTHead neg      - Checked othostatics and these were consistent with dehydration.      - Was appropriately fluid resuscitated.      - Echo showed mod severe TR, but nl EF at 55-60, f/up PCP       -Discussed with family who prefer to take patient home, hydrate orally.    Patient's son was in the pathology department at [facility] is attentive and available with patient living with them currently  Hyponatremia     - mild, 132 mission; monitor     -No neurological symptoms   Discharge Diagnoses:  Principal Problem:   Syncope Active Problems:   Normocytic anemia   Hyponatremia    Discharge Instructions  Discharge Instructions    Call MD for:   Complete by: As directed    For any acute change in medical condition   Diet - low sodium heart healthy   Complete by: As directed    Increase activity slowly   Complete by: As directed      Allergies as of 04/04/2019   No Known Allergies     Medication  List    TAKE these medications   amLODipine 5 MG tablet Commonly known as: NORVASC Take 5 mg by mouth daily.   aspirin 81 MG chewable tablet Commonly known as: Aspirin Childrens Chew 1 tablet (81 mg total) by mouth 2 (two) times daily. Take for 4 weeks, then resume regular dose.   carvedilol 6.25 MG tablet Commonly known as: COREG Take 6.25 mg by mouth 2 (two) times daily with a meal.   [employer] Eye Lubricant Oint Place 1 drop into both eyes daily.   [employer] Sodium Chloride 5 % ophthalmic solution Generic drug: sodium chloride Place 1 drop into the right eye every 4 (four) hours as needed for eye irritation.   docusate sodium 100 MG capsule Commonly known as: Colace Take 1 capsule (100 mg total) by mouth 2 (two) times daily.   ferrous sulfate 325 (65 FE) MG tablet Commonly known as: FerrouSul Take 1 tablet (325 mg  total) by mouth 3 (three) times daily with meals for 14 days.   HYDROcodone-acetaminophen 5-325 MG tablet Commonly known as: Norco Take 1-2 tablets by mouth every 6 (six) hours as needed for moderate pain or severe pain.   methocarbamol 500 MG tablet Commonly known as: Robaxin Take 1 tablet (500 mg total) by mouth every 6 (six) hours as needed for muscle spasms.   multivitamin tablet Take 1 tablet by mouth daily.   polyethylene glycol 17 g packet Commonly known as: MIRALAX / GLYCOLAX Take 17 g by mouth 2 (two) times daily.   valsartan-hydrochlorothiazide 320-12.5 MG tablet Commonly known as: DIOVAN-HCT Take 1 tablet by mouth daily.   Vitamin D 50 MCG (2000 UT) Caps Take by mouth daily.       No Known Allergies  Consultations:  None   Procedures/Studies: Ct Head Wo Contrast  Result Date: 04/03/2019 CLINICAL DATA:  Syncope. Status post knee replacement surgery on September 17th. EXAM: CT HEAD WITHOUT CONTRAST TECHNIQUE: Contiguous axial images were obtained from the base of the skull through the vertex without intravenous contrast. COMPARISON:  None. FINDINGS: Brain: Mild generalized age related parenchymal volume loss with commensurate dilatation of the ventricles and sulci. No mass, hemorrhage, edema or other evidence of acute parenchymal abnormality. No extra-axial hemorrhage. Vascular: No hyperdense vessel or unexpected calcification. Skull: Normal. Negative for fracture or focal lesion. Sinuses/Orbits: No acute finding. Other: None. IMPRESSION: Negative head CT. No intracranial mass, hemorrhage or edema. Electronically Signed   By: Franki Cabot M.D.   On: 04/03/2019 17:20   Ct Angio Chest Pe W Or Wo Contrast  Result Date: 04/03/2019 CLINICAL DATA:  PE suspected EXAM: CT ANGIOGRAPHY CHEST WITH CONTRAST TECHNIQUE: Multidetector CT imaging of the chest was performed using the standard protocol during bolus administration of intravenous contrast. Multiplanar CT image  reconstructions and MIPs were obtained to evaluate the vascular anatomy. CONTRAST:  112mL OMNIPAQUE IOHEXOL 350 MG/ML SOLN COMPARISON:  None. FINDINGS: Cardiovascular: Satisfactory opacification of the pulmonary arteries to the segmental level. No evidence of pulmonary embolism. Cardiomegaly. No pericardial effusion. Mediastinum/Nodes: No enlarged mediastinal, hilar, or axillary lymph nodes. Thyroid gland, trachea, and esophagus demonstrate no significant findings. Lungs/Pleura: Lungs are clear. No pleural effusion or pneumothorax. Upper Abdomen: Please see separately dictated CT examination of the abdomen and pelvis. Musculoskeletal: No chest wall abnormality. No acute or significant osseous findings. Review of the MIP images confirms the above findings. IMPRESSION: 1.  Negative examination for pulmonary embolism. 2.  Aortic Atherosclerosis (ICD10-I70.0). Electronically Signed   By: Dorna Bloom.D.  On: 04/03/2019 17:17   Ct Abdomen Pelvis W Contrast  Result Date: 04/03/2019 CLINICAL DATA:  GI bleed, anemia, syncope EXAM: CT ABDOMEN AND PELVIS WITH CONTRAST TECHNIQUE: Multidetector CT imaging of the abdomen and pelvis was performed using the standard protocol following bolus administration of intravenous contrast. CONTRAST:  115mL OMNIPAQUE IOHEXOL 350 MG/ML SOLN COMPARISON:  None. FINDINGS: Lower chest: Please see separately dictated CT examination of the chest. Hepatobiliary: No solid liver abnormality is seen. No gallstones, gallbladder wall thickening, or biliary dilatation. Pancreas: Unremarkable. No pancreatic ductal dilatation or surrounding inflammatory changes. Spleen: Normal in size without significant abnormality. Adrenals/Urinary Tract: Adrenal glands are unremarkable. Mild bilateral hydronephrosis and hydroureter. Kidneys are otherwise normal, without renal calculi, solid lesion, or hydronephrosis. Distended urinary bladder containing small locules of air. Stomach/Bowel: Stomach is within normal  limits. Appendix last not visualized. No evidence of bowel wall thickening, distention, or inflammatory changes. Large burden of stool in the colon and rectum. Vascular/Lymphatic: Aortic atherosclerosis. 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 No enlarged abdominal or pelvic lymph nodes. Reproductive: No mass or other significant abnormality. Other: No abdominal wall hernia or abnormality. No abdominopelvic ascites. Musculoskeletal: No acute or significant osseous findings. IMPRESSION: 1. No CT findings of the abdomen or pelvis to explain anemia. No evidence of retroperitoneal hemorrhage or evidence of gastrointestinal bleeding. 2.  Large burden of stool in the colon and rectum. 3. Distention of the urinary bladder, containing small locules of air. Mild bilateral hydronephrosis and hydroureter, likely due to back pressure without evidence of obstructing calculus or other lesion. Correlate for urinary retention and recent catheterization. 4. 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 5.  Aortic Atherosclerosis (ICD10-I70.0). Electronically Signed   By: Eddie Candle M.D.   On: 04/03/2019 17:21       Subjective: Patient reports she feels fine Patient home without any complications Request to be discharged home  Discharge Exam: Vitals:   04/04/19 1036 04/04/19 1256  BP: (!) 95/53 115/70  Pulse: 97 72  Resp:  20  Temp:  98.4 F (36.9 C)  SpO2:  96%   Vitals:   04/04/19 1034 04/04/19 1035 04/04/19 1036 04/04/19 1256  BP: (!) 127/55 (!) 127/55 (!) 95/53 115/70  Pulse: 75 85 97 72  Resp:    20  Temp:    98.4 F (36.9 C)  TempSrc:    Oral  SpO2:    96%  Weight:      Height:        General: Pt is alert, awake, not in acute distress Cardiovascular: RRR, S1/S2 +, no rubs, no gallops Respiratory: CTA bilaterally, no wheezing, no rhonchi Abdominal: Soft, NT, ND,  bowel sounds + Extremities: no edema, no cyanosis    The results of significant diagnostics from this hospitalization (including imaging, microbiology, ancillary and laboratory) are listed below for reference.     Microbiology: Recent Results (from the past 240 hour(s))  Novel Coronavirus, NAA (Hosp order, Send-out to Ref Lab; TAT 18-24 hrs     Status: None   Collection Time: 03/29/19 10:50 AM   Specimen: Nasopharyngeal Swab; Respiratory  Result Value Ref Range Status   SARS-CoV-2, NAA NOT DETECTED NOT DETECTED Final    Comment: (NOTE) This nucleic acid amplification test was developed and its performance characteristics determined by [employer]. Nucleic acid amplification tests include PCR and TMA. This test has not  been FDA cleared or approved. This test has been authorized by FDA under an Emergency Use Authorization (EUA). This test is only authorized for the duration of time the declaration that circumstances exist justifying the authorization of the emergency use of in vitro diagnostic tests for detection of SARS-CoV-2 virus and/or diagnosis of COVID-19 infection under section 564(b)(1) of the Act, 21 U.S.C. [id number]) (1), unless the authorization is terminated or revoked sooner. When diagnostic testing is negative, the possibility of a false negative result should be considered in the context of a patient's recent exposures and the presence of clinical signs and symptoms consistent with COVID-19. An individual without symptoms of COVID- 19 and who is not shedding SARS-CoV-2 vi rus would expect to have a negative (not detected) result in this assay. Performed At: [facility] [address] [place], [place] [id number] Rush Farmer MD [id number]    [place]  Final    Comment: Performed at [facility], [place] [address] [place], [place] [address]  Novel Coronavirus, NAA (Hosp order, Send-out to Ref Lab; TAT 18-24 hrs      Status: None   Collection Time: 04/03/19  2:43 PM   Specimen: Nasopharyngeal Swab; Respiratory  Result Value Ref Range Status   SARS-CoV-2, NAA NOT DETECTED NOT DETECTED Final    Comment: (NOTE) This nucleic acid amplification test was developed and its performance characteristics determined by [employer]. Nucleic acid amplification tests include PCR and TMA. This test has not been FDA cleared or approved. This test has been authorized by FDA under an Emergency Use Authorization (EUA). This test is only authorized for the duration of time the declaration that circumstances exist justifying the authorization of the emergency use of in vitro diagnostic tests for detection of SARS-CoV-2 virus and/or diagnosis of COVID-19 infection under section 564(b)(1) of the Act, 21 U.S.C. [id number]) (1), unless the authorization is terminated or revoked sooner. When diagnostic testing is negative, the possibility of a false negative result should be considered in the context of a patient's recent exposures and the presence of clinical signs and symptoms consistent with COVID-19. An individual without symptoms of COVID- 19 and who is not shedding SARS-CoV-2 vi rus would expect to have a negative (not detected) result in this assay. Performed At: [facility] [place], [place] [id number] Rush Farmer MD [id number]    [place]  Final    Comment: Performed at [facility] [address] [place],  [address]     Labs: BNP (last 3 results) No results for input(s): BNP in the last 8760 hours. Basic Metabolic Panel: Recent Labs  Lab 04/01/19 0822 04/02/19 0243 04/03/19 1106 04/04/19 0419  NA 134* 130* 132* 130*  K 3.5 4.1 3.5 4.2  CL 98 99 102 96*  CO2 26 23 22 26   GLUCOSE 108* 128* 96 108*  BUN 14 17 15 9   CREATININE 0.66 0.67 0.45 0.42*  CALCIUM 9.0 8.2* 7.2* 8.3*   Liver Function Tests: Recent  Labs  Lab 04/04/19 0419  AST 20  ALT 13  ALKPHOS 67  BILITOT 1.1  PROT 5.6*  ALBUMIN 2.8*   No results for input(s): LIPASE, AMYLASE in the last 168 hours. No results for input(s): AMMONIA in the last 168 hours. CBC: Recent Labs  Lab 04/02/19 0243 04/03/19 1106 04/04/19 0419  WBC 9.5 10.0 9.4  NEUTROABS  --  7.4  --   HGB 8.9* 7.6* 9.6*  HCT 27.3* 23.2* 29.8*  MCV 96.8 98.7 95.5  PLT 177 181 162   Cardiac Enzymes: No results for input(s): CKTOTAL, CKMB, CKMBINDEX, TROPONINI in the last 168 hours. BNP: Invalid input(s): POCBNP CBG: Recent Labs  Lab 04/03/19 1116 04/04/19 0423  GLUCAP 98 96   D-Dimer No results for input(s): DDIMER in the last 72 hours. Hgb A1c No results for input(s): HGBA1C in the last 72 hours. Lipid Profile No results for input(s): CHOL, HDL, LDLCALC, TRIG, CHOLHDL, LDLDIRECT in the last 72 hours. Thyroid function studies No results for input(s): TSH, T4TOTAL, T3FREE, THYROIDAB in the last 72 hours.  Invalid input(s): FREET3 Anemia work up Recent Labs    04/03/19 1442 04/03/19 1443  VITAMINB12 304  --   FOLATE  --  15.8  FERRITIN 98  --   TIBC 250  --   IRON 18*  --   RETICCTPCT  --  3.1   Urinalysis    Component Value Date/Time   COLORURINE YELLOW 09/20/2009 1520   APPEARANCEUR CLOUDY (A) 09/20/2009 1520   LABSPEC 1.014 09/20/2009 1520   PHURINE 6.5 09/20/2009 1520   GLUCOSEU NEGATIVE 09/20/2009 1520   HGBUR NEGATIVE 09/20/2009 1520   BILIRUBINUR NEGATIVE 09/20/2009 1520   KETONESUR NEGATIVE 09/20/2009 1520   PROTEINUR NEGATIVE 09/20/2009 1520   UROBILINOGEN 0.2 09/20/2009 1520   NITRITE NEGATIVE 09/20/2009 1520   LEUKOCYTESUR TRACE (A) 09/20/2009 1520   Sepsis Labs Invalid input(s): PROCALCITONIN,  WBC,  LACTICIDVEN Microbiology Recent Results (from the past 240 hour(s))  Novel Coronavirus, NAA (Hosp order, Send-out to [facility]; TAT 18-24 hrs     Status: None   Collection Time: 03/29/19 10:50 AM   Specimen:  Nasopharyngeal Swab; Respiratory  Result Value Ref Range Status   SARS-CoV-2, NAA NOT DETECTED NOT DETECTED Final    Comment: (NOTE) This nucleic acid amplification test was developed and its performance characteristics determined by [employer]. Nucleic acid amplification tests include PCR and TMA. This test has not been FDA cleared or approved. This test has been authorized by FDA under an Emergency Use Authorization (EUA). This test is only authorized for the duration of time the declaration that circumstances exist justifying the authorization of the emergency use of in vitro diagnostic tests for detection of SARS-CoV-2 virus and/or diagnosis of COVID-19 infection under section 564(b)(1) of the Act, 21 U.S.C. [id number]) (1), unless the authorization is terminated or revoked sooner. When diagnostic testing is negative, the possibility of a false negative result should be considered in the context of a patient's recent exposures and the presence of clinical signs and symptoms consistent with COVID-19. An individual without symptoms of COVID- 19 and who is not shedding SARS-CoV-2 vi rus would expect to have a negative (not detected) result in this assay. Performed At: [facility] [address] [place], [place] [id number] Rush Farmer MD [id number]    [place]  Final    Comment: Performed at [facility], [place] [address], [place], [place] [address]  Novel Coronavirus, NAA (Hosp order, Send-out to Ref Lab; TAT 18-24 hrs     Status: None   Collection Time: 04/03/19  2:43 PM   Specimen: Nasopharyngeal Swab; Respiratory  Result Value Ref Range Status   SARS-CoV-2, NAA NOT DETECTED NOT DETECTED Final    Comment: (NOTE) This nucleic acid amplification test was developed and its performance characteristics determined by [employer]. Nucleic acid amplification tests include PCR and TMA. This test has not been FDA cleared or  approved. This test has been authorized  by FDA under an Emergency Use Authorization (EUA). This test is only authorized for the duration of time the declaration that circumstances exist justifying the authorization of the emergency use of in vitro diagnostic tests for detection of SARS-CoV-2 virus and/or diagnosis of COVID-19 infection under section 564(b)(1) of the Act, 21 U.S.C. [id number]) (1), unless the authorization is terminated or revoked sooner. When diagnostic testing is negative, the possibility of a false negative result should be considered in the context of a patient's recent exposures and the presence of clinical signs and symptoms consistent with COVID-19. An individual without symptoms of COVID- 19 and who is not shedding SARS-CoV-2 vi rus would expect to have a negative (not detected) result in this assay. Performed At: [facility] [place], [place] [id number] Rush Farmer MD [id number]    [place]  Final    Comment: Performed at [facility] [address] [place], [place] [address]     Time coordinating discharge: Over 30 minutes  SIGNED:   Nicolette Bang, MD  [facility] 04/04/2019, 2:51 PM Pager   If 7PM-7AM, please contact night-coverage [email or web address] Password [email or web address]

## 2019-04-04 NOTE — Progress Notes (Signed)
  Echocardiogram 2D Echocardiogram has been performed.  Michelle Mosley 04/04/2019, 9:57 AM

## 2019-04-04 NOTE — Plan of Care (Signed)

## 2019-04-05 LAB — TYPE AND SCREEN
ABO/RH(D): A POS
Antibody Screen: NEGATIVE
Unit division: 0

## 2019-04-05 LAB — BPAM RBC
Blood Product Expiration Date: 202010112359
ISSUE DATE / TIME: 202009191848
Unit Type and Rh: 6200

## 2019-04-06 NOTE — Discharge Summary (Signed)
Physician Discharge Summary  Patient ID: Michelle Mosley MRN: [id number] DOB/AGE: August 27, 1932 85 y.o.  Admit date: 04/01/2019 Discharge date: 04/02/2019   Procedures:  Procedure(s) (LRB): TOTAL KNEE ARTHROPLASTY (Left)  Attending Physician:  Dr. [name]   Admission Diagnoses:   Left knee primary OA / pain  Discharge Diagnoses:  Principal Problem:   S/P left TKA Active Problems:   Overweight (BMI 25.0-29.9)  Past Medical History:  Diagnosis Date  . Arthritis   . DVT (deep venous thrombosis) ([place])   . Hypertension     HPI:    Michelle Mosley, 85 y.o. female, has a history of pain and functional disability in the left knee due to arthritis and has failed non-surgical conservative treatments for greater than 12 weeks to include NSAID's and/or analgesics, corticosteriod injections and activity modification.  Onset of symptoms was gradual, starting >10 years ago with gradually worsening course since that time. The patient noted no past surgery on the left knee(s).  Patient currently rates pain in the left knee(s) at 7 out of 10 with activity. Patient has night pain, worsening of pain with activity and weight bearing, pain that interferes with activities of daily living, pain with passive range of motion, crepitus and joint swelling.  Patient has evidence of periarticular osteophytes and joint space narrowing by imaging studies. There is no active infection.  Risks, benefits and expectations were discussed with the patient.  Risks including but not limited to the risk of anesthesia, blood clots, nerve damage, blood vessel damage, failure of the prosthesis, infection and up to and including death.  Patient understand the risks, benefits and expectations and wishes to proceed with surgery.   PCP: Lajean Manes, MD   Discharged Condition: good  Hospital Course:  Patient underwent the above stated procedure on 04/01/2019. Patient tolerated the procedure well and brought to the recovery  room in good condition and subsequently to the floor.  POD #1 BP: 147/71 ; Pulse: 74 ; Temp: 98.2 F (36.8 C) ; Resp: 16 Patient reports pain as mild, pain controlled medication.  No reported events throughout the night.  Patient feels that she is doing well and is looking forward to improving.  The procedure, findings and expectations moving forward were discussed with the patient.  Patient is ready be discharged home. Dorsiflexion/plantar flexion intact, incision: dressing C/D/I, no cellulitis present and compartment soft.   LABS  Basename    HGB     8.9  HCT     27.3    Discharge Exam: General appearance: alert, cooperative and no distress Extremities: Homans sign is negative, no sign of DVT, no edema, redness or tenderness in the calves or thighs and no ulcers, gangrene or trophic changes  Disposition:  Home with follow up in 2 weeks   Follow-up Information    [name], MD. Schedule an appointment as soon as possible for a visit in 2 weeks.   Specialty: Orthopedic Surgery Contact information: [address] [place] [address] [phone]           Discharge Instructions    Call MD / Call 911   Complete by: As directed    If you experience chest pain or shortness of breath, CALL 911 and be transported to the hospital emergency room.  If you develope a fever above 101 F, pus (white drainage) or increased drainage or redness at the wound, or calf pain, call your surgeon's office.   Change dressing   Complete by: As  directed    Maintain surgical dressing until follow up in the clinic. If the edges start to pull up, may reinforce with tape. If the dressing is no longer working, may remove and cover with gauze and tape, but must keep the area dry and clean.  Call with any questions or concerns.   Constipation Prevention   Complete by: As directed    Drink plenty of fluids.  Prune juice may be helpful.  You may use a stool softener, such as Colace (over  the counter) 100 mg twice a day.  Use MiraLax (over the counter) for constipation as needed.   Diet - low sodium heart healthy   Complete by: As directed    Discharge instructions   Complete by: As directed    Maintain surgical dressing until follow up in the clinic. If the edges start to pull up, may reinforce with tape. If the dressing is no longer working, may remove and cover with gauze and tape, but must keep the area dry and clean.  Follow up in 2 weeks at [facility]. Call with any questions or concerns.   Increase activity slowly as tolerated   Complete by: As directed    Weight bearing as tolerated with assist device (walker, cane, etc) as directed, use it as long as suggested by your surgeon or therapist, typically at least 4-6 weeks.   TED hose   Complete by: As directed    Use stockings (TED hose) for 2 weeks on both leg(s).  You may remove them at night for sleeping.      Allergies as of 04/02/2019   No Known Allergies     Medication List    STOP taking these medications   acetaminophen 650 MG CR tablet Commonly known as: TYLENOL     TAKE these medications   amLODipine 5 MG tablet Commonly known as: NORVASC Take 5 mg by mouth daily.   aspirin 81 MG chewable tablet Commonly known as: Aspirin Childrens Chew 1 tablet (81 mg total) by mouth 2 (two) times daily. Take for 4 weeks, then resume regular dose.   carvedilol 6.25 MG tablet Commonly known as: COREG Take 6.25 mg by mouth 2 (two) times daily with a meal.   [employer] Eye Lubricant Oint Place 1 drop into both eyes daily.   [employer]S Sodium Chloride 5 % ophthalmic solution Generic drug: sodium chloride Place 1 drop into the right eye every 4 (four) hours as needed for eye irritation.   docusate sodium 100 MG capsule Commonly known as: Colace Take 1 capsule (100 mg total) by mouth 2 (two) times daily.   ferrous sulfate 325 (65 FE) MG tablet Commonly known as: FerrouSul Take 1 tablet (325 mg total) by  mouth 3 (three) times daily with meals for 14 days.   HYDROcodone-acetaminophen 5-325 MG tablet Commonly known as: Norco Take 1-2 tablets by mouth every 6 (six) hours as needed for moderate pain or severe pain.   methocarbamol 500 MG tablet Commonly known as: Robaxin Take 1 tablet (500 mg total) by mouth every 6 (six) hours as needed for muscle spasms.   multivitamin tablet Take 1 tablet by mouth daily.   polyethylene glycol 17 g packet Commonly known as: MIRALAX / GLYCOLAX Take 17 g by mouth 2 (two) times daily.   valsartan-hydrochlorothiazide 320-12.5 MG tablet Commonly known as: DIOVAN-HCT Take 1 tablet by mouth daily.   Vitamin D 50 MCG (2000 UT) Caps Take by mouth daily.  Discharge Care Instructions  (From admission, onward)         Start     Ordered   04/02/19 0000  Change dressing    Comments: Maintain surgical dressing until follow up in the clinic. If the edges start to pull up, may reinforce with tape. If the dressing is no longer working, may remove and cover with gauze and tape, but must keep the area dry and clean.  Call with any questions or concerns.   04/02/19 [id number]           Signed: [place] P[place]. Lace Chenevert   PA-C  04/06/2019, 2:09 PM

## 2019-04-07 DIAGNOSIS — D649 Anemia, unspecified: Secondary | ICD-10-CM | POA: Diagnosis not present

## 2019-04-07 DIAGNOSIS — R55 Syncope and collapse: Secondary | ICD-10-CM | POA: Diagnosis not present

## 2019-04-07 DIAGNOSIS — I10 Essential (primary) hypertension: Secondary | ICD-10-CM | POA: Diagnosis not present

## 2019-04-07 DIAGNOSIS — E871 Hypo-osmolality and hyponatremia: Secondary | ICD-10-CM | POA: Diagnosis not present

## 2019-04-09 DIAGNOSIS — Z86718 Personal history of other venous thrombosis and embolism: Secondary | ICD-10-CM | POA: Diagnosis not present

## 2019-04-09 DIAGNOSIS — Z96652 Presence of left artificial knee joint: Secondary | ICD-10-CM | POA: Diagnosis not present

## 2019-04-09 DIAGNOSIS — D649 Anemia, unspecified: Secondary | ICD-10-CM | POA: Diagnosis not present

## 2019-04-09 DIAGNOSIS — Z471 Aftercare following joint replacement surgery: Secondary | ICD-10-CM | POA: Diagnosis not present

## 2019-04-09 DIAGNOSIS — I10 Essential (primary) hypertension: Secondary | ICD-10-CM | POA: Diagnosis not present

## 2019-04-13 DIAGNOSIS — D649 Anemia, unspecified: Secondary | ICD-10-CM | POA: Diagnosis not present

## 2019-04-13 DIAGNOSIS — Z86718 Personal history of other venous thrombosis and embolism: Secondary | ICD-10-CM | POA: Diagnosis not present

## 2019-04-13 DIAGNOSIS — Z96652 Presence of left artificial knee joint: Secondary | ICD-10-CM | POA: Diagnosis not present

## 2019-04-13 DIAGNOSIS — I10 Essential (primary) hypertension: Secondary | ICD-10-CM | POA: Diagnosis not present

## 2019-04-13 DIAGNOSIS — Z471 Aftercare following joint replacement surgery: Secondary | ICD-10-CM | POA: Diagnosis not present

## 2019-04-14 DIAGNOSIS — Z96652 Presence of left artificial knee joint: Secondary | ICD-10-CM | POA: Diagnosis not present

## 2019-04-14 DIAGNOSIS — D649 Anemia, unspecified: Secondary | ICD-10-CM | POA: Diagnosis not present

## 2019-04-14 DIAGNOSIS — Z471 Aftercare following joint replacement surgery: Secondary | ICD-10-CM | POA: Diagnosis not present

## 2019-04-14 DIAGNOSIS — I10 Essential (primary) hypertension: Secondary | ICD-10-CM | POA: Diagnosis not present

## 2019-04-14 DIAGNOSIS — Z86718 Personal history of other venous thrombosis and embolism: Secondary | ICD-10-CM | POA: Diagnosis not present

## 2019-04-16 DIAGNOSIS — I10 Essential (primary) hypertension: Secondary | ICD-10-CM | POA: Diagnosis not present

## 2019-04-16 DIAGNOSIS — D649 Anemia, unspecified: Secondary | ICD-10-CM | POA: Diagnosis not present

## 2019-04-16 DIAGNOSIS — Z96652 Presence of left artificial knee joint: Secondary | ICD-10-CM | POA: Diagnosis not present

## 2019-04-16 DIAGNOSIS — Z86718 Personal history of other venous thrombosis and embolism: Secondary | ICD-10-CM | POA: Diagnosis not present

## 2019-04-16 DIAGNOSIS — Z471 Aftercare following joint replacement surgery: Secondary | ICD-10-CM | POA: Diagnosis not present

## 2019-04-19 DIAGNOSIS — Z86718 Personal history of other venous thrombosis and embolism: Secondary | ICD-10-CM | POA: Diagnosis not present

## 2019-04-19 DIAGNOSIS — I10 Essential (primary) hypertension: Secondary | ICD-10-CM | POA: Diagnosis not present

## 2019-04-19 DIAGNOSIS — Z471 Aftercare following joint replacement surgery: Secondary | ICD-10-CM | POA: Diagnosis not present

## 2019-04-19 DIAGNOSIS — Z96652 Presence of left artificial knee joint: Secondary | ICD-10-CM | POA: Diagnosis not present

## 2019-04-19 DIAGNOSIS — D649 Anemia, unspecified: Secondary | ICD-10-CM | POA: Diagnosis not present

## 2019-04-20 DIAGNOSIS — M1712 Unilateral primary osteoarthritis, left knee: Secondary | ICD-10-CM | POA: Diagnosis not present

## 2019-04-22 DIAGNOSIS — M1712 Unilateral primary osteoarthritis, left knee: Secondary | ICD-10-CM | POA: Diagnosis not present

## 2019-04-27 DIAGNOSIS — M1712 Unilateral primary osteoarthritis, left knee: Secondary | ICD-10-CM | POA: Diagnosis not present

## 2019-04-27 DIAGNOSIS — D649 Anemia, unspecified: Secondary | ICD-10-CM | POA: Diagnosis not present

## 2019-04-27 DIAGNOSIS — I10 Essential (primary) hypertension: Secondary | ICD-10-CM | POA: Diagnosis not present

## 2019-04-28 DIAGNOSIS — D649 Anemia, unspecified: Secondary | ICD-10-CM | POA: Diagnosis not present

## 2019-04-29 DIAGNOSIS — M1712 Unilateral primary osteoarthritis, left knee: Secondary | ICD-10-CM | POA: Diagnosis not present

## 2019-04-30 DIAGNOSIS — M1712 Unilateral primary osteoarthritis, left knee: Secondary | ICD-10-CM | POA: Diagnosis not present

## 2019-05-04 DIAGNOSIS — M1712 Unilateral primary osteoarthritis, left knee: Secondary | ICD-10-CM | POA: Diagnosis not present

## 2019-05-06 DIAGNOSIS — M1712 Unilateral primary osteoarthritis, left knee: Secondary | ICD-10-CM | POA: Diagnosis not present

## 2019-05-07 DIAGNOSIS — M1712 Unilateral primary osteoarthritis, left knee: Secondary | ICD-10-CM | POA: Diagnosis not present

## 2019-05-12 DIAGNOSIS — M1712 Unilateral primary osteoarthritis, left knee: Secondary | ICD-10-CM | POA: Diagnosis not present

## 2019-05-13 DIAGNOSIS — Z471 Aftercare following joint replacement surgery: Secondary | ICD-10-CM | POA: Diagnosis not present

## 2019-05-13 DIAGNOSIS — Z96652 Presence of left artificial knee joint: Secondary | ICD-10-CM | POA: Diagnosis not present

## 2019-05-14 DIAGNOSIS — M1712 Unilateral primary osteoarthritis, left knee: Secondary | ICD-10-CM | POA: Diagnosis not present

## 2019-05-18 DIAGNOSIS — D62 Acute posthemorrhagic anemia: Secondary | ICD-10-CM | POA: Diagnosis not present

## 2019-05-18 DIAGNOSIS — I10 Essential (primary) hypertension: Secondary | ICD-10-CM | POA: Diagnosis not present

## 2019-05-18 DIAGNOSIS — K5900 Constipation, unspecified: Secondary | ICD-10-CM | POA: Diagnosis not present

## 2019-05-19 DIAGNOSIS — M1712 Unilateral primary osteoarthritis, left knee: Secondary | ICD-10-CM | POA: Diagnosis not present

## 2019-05-25 DIAGNOSIS — M1712 Unilateral primary osteoarthritis, left knee: Secondary | ICD-10-CM | POA: Diagnosis not present

## 2019-05-27 DIAGNOSIS — M1712 Unilateral primary osteoarthritis, left knee: Secondary | ICD-10-CM | POA: Diagnosis not present

## 2019-05-31 DIAGNOSIS — M1712 Unilateral primary osteoarthritis, left knee: Secondary | ICD-10-CM | POA: Diagnosis not present

## 2019-06-02 DIAGNOSIS — K5901 Slow transit constipation: Secondary | ICD-10-CM | POA: Diagnosis not present

## 2019-06-02 DIAGNOSIS — I10 Essential (primary) hypertension: Secondary | ICD-10-CM | POA: Diagnosis not present

## 2019-06-03 DIAGNOSIS — M1712 Unilateral primary osteoarthritis, left knee: Secondary | ICD-10-CM | POA: Diagnosis not present

## 2019-06-07 DIAGNOSIS — M1712 Unilateral primary osteoarthritis, left knee: Secondary | ICD-10-CM | POA: Diagnosis not present

## 2019-06-11 DIAGNOSIS — M1712 Unilateral primary osteoarthritis, left knee: Secondary | ICD-10-CM | POA: Diagnosis not present

## 2019-06-22 DIAGNOSIS — R208 Other disturbances of skin sensation: Secondary | ICD-10-CM | POA: Diagnosis not present

## 2019-06-22 DIAGNOSIS — L718 Other rosacea: Secondary | ICD-10-CM | POA: Diagnosis not present

## 2019-06-22 DIAGNOSIS — D225 Melanocytic nevi of trunk: Secondary | ICD-10-CM | POA: Diagnosis not present

## 2019-06-22 DIAGNOSIS — Z85828 Personal history of other malignant neoplasm of skin: Secondary | ICD-10-CM | POA: Diagnosis not present

## 2019-06-22 DIAGNOSIS — L821 Other seborrheic keratosis: Secondary | ICD-10-CM | POA: Diagnosis not present

## 2019-06-22 DIAGNOSIS — L820 Inflamed seborrheic keratosis: Secondary | ICD-10-CM | POA: Diagnosis not present

## 2019-06-22 DIAGNOSIS — L814 Other melanin hyperpigmentation: Secondary | ICD-10-CM | POA: Diagnosis not present

## 2019-06-22 DIAGNOSIS — D1801 Hemangioma of skin and subcutaneous tissue: Secondary | ICD-10-CM | POA: Diagnosis not present

## 2019-06-22 DIAGNOSIS — L570 Actinic keratosis: Secondary | ICD-10-CM | POA: Diagnosis not present

## 2019-07-13 ENCOUNTER — Ambulatory Visit (HOSPITAL_COMMUNITY)
Admission: RE | Admit: 2019-07-13 | Discharge: 2019-07-13 | Disposition: A | Discharge status: Home / Self Care | Payer: Medicare Other | Source: Ambulatory Visit | Attending: Physician Assistant | Admitting: Physician Assistant

## 2019-07-13 ENCOUNTER — Other Ambulatory Visit: Payer: Self-pay

## 2019-07-13 VITALS — BP 118/64 | HR 100 | Ht 67.0 in | Wt 149.4 lb

## 2019-07-13 DIAGNOSIS — I4891 Unspecified atrial fibrillation: Secondary | ICD-10-CM

## 2019-07-13 DIAGNOSIS — Z96652 Presence of left artificial knee joint: Secondary | ICD-10-CM | POA: Diagnosis not present

## 2019-07-13 DIAGNOSIS — I10 Essential (primary) hypertension: Secondary | ICD-10-CM | POA: Insufficient documentation

## 2019-07-13 DIAGNOSIS — Z79899 Other long term (current) drug therapy: Secondary | ICD-10-CM | POA: Diagnosis not present

## 2019-07-13 DIAGNOSIS — Z86718 Personal history of other venous thrombosis and embolism: Secondary | ICD-10-CM | POA: Diagnosis not present

## 2019-07-13 DIAGNOSIS — M1990 Unspecified osteoarthritis, unspecified site: Secondary | ICD-10-CM | POA: Insufficient documentation

## 2019-07-13 DIAGNOSIS — Z800 Family history of malignant neoplasm of digestive organs: Secondary | ICD-10-CM | POA: Diagnosis not present

## 2019-07-13 DIAGNOSIS — I491 Atrial premature depolarization: Secondary | ICD-10-CM

## 2019-07-13 NOTE — Progress Notes (Signed)
Primary Care Physician: Lajean Manes, MD Primary Cardiologist: none Primary Electrophysiologist: none Referring Physician: Dr Wendall Papa Michelle Mosley is a 85 y.o. female with a history of HTN, anemia, and new onset atrial fibrillation who presents for consultation in the [facility].  The patient was initially diagnosed with atrial fibrillation on 07/12/19 at her PCP office after presenting with symptoms of heart racing. ECG read as afib with RVR. She was prescribed Eliquis for a CHADS2VASC score of 4 and her amlodipine was changed to diltiazem. Patient reports that her heart rate has been in the 70s-80s at home since the medication change. She has not started the Eliquis yet. She denies snoring or significant alcohol use. She does report that she has had several episodes of diarrhea prior to this episode.  Today, she denies symptoms of chest pain, shortness of breath, orthopnea, PND, lower extremity edema, dizziness, presyncope, syncope, snoring, daytime somnolence, bleeding, or neurologic sequela. The patient is tolerating medications without difficulties and is otherwise without complaint today.    Atrial Fibrillation Risk Factors:  she does not have symptoms or diagnosis of sleep apnea. she does not have a history of rheumatic fever. she does not have a history of alcohol use. The patient does not have a history of early familial atrial fibrillation or other arrhythmias.  she has a BMI of Body mass index is 23.4 kg/m.Marland Kitchen Filed Weights   07/13/19 1357  Weight: 67.8 kg    Family History  Problem Relation Age of Onset  . Colon cancer Mother      Atrial Fibrillation Management history:  Previous antiarrhythmic drugs: none Previous cardioversions: none Previous ablations: none CHADS2VASC score: 4 Anticoagulation history: Eliquis   Past Medical History:  Diagnosis Date  . Arthritis   . DVT (deep venous thrombosis) ([place])   . Hypertension     Past Surgical History:  Procedure Laterality Date  . broken neck  1976  . [place]  . excision of breast cyst Left   . TOTAL KNEE ARTHROPLASTY Left 04/01/2019   Procedure: TOTAL KNEE ARTHROPLASTY;  Surgeon: [name], MD;  Location: [facility];  Service: Orthopedics;  Laterality: Left;  70 mins    Current Outpatient Medications  Medication Sig Dispense Refill  . carvedilol (COREG) 6.25 MG tablet Take 6.25 mg by mouth 2 (two) times daily with a meal.    . Cholecalciferol (VITAMIN D) 2000 units CAPS Take by mouth daily.    Marland Kitchen diltiazem (DILTIAZEM CD) 120 MG 24 hr capsule Take 120 mg by mouth daily.    . metroNIDAZOLE (METROCREAM) 0.75 % cream as needed.     . moxifloxacin (VIGAMOX) 0.5 % ophthalmic solution moxifloxacin 0.5 % eye drops    . polyethylene glycol (MIRALAX / GLYCOLAX) 17 g packet Take 17 g by mouth 2 (two) times daily. (Patient taking differently: Take 17 g by mouth daily. ) 28 packet 0  . sodium chloride (CVS SODIUM CHLORIDE) 5 % ophthalmic solution Place 1 drop into the right eye every 4 (four) hours as needed for eye irritation.    . valsartan-hydrochlorothiazide (DIOVAN-HCT) 320-12.5 MG per tablet Take 1 tablet by mouth daily.    . White Petrolatum-Mineral Oil (CVS EYE LUBRICANT) OINT Place 1 drop into both eyes daily.    Marland Kitchen apixaban (ELIQUIS) 5 MG TABS tablet Take 5 mg by mouth 2 (two) times daily.     No current facility-administered medications for this encounter.    No Known  Allergies  Social History   Socioeconomic History  . Marital status: Widowed    Spouse name: Not on file  . Number of children: Not on file  . Years of education: Not on file  . Highest education level: Not on file  Occupational History  . Not on file  Tobacco Use  . Smoking status: Never Smoker  . Smokeless tobacco: Never Used  Substance and Sexual Activity  . Alcohol use: No  . Drug use: Never  . Sexual activity: Not on file  Other Topics Concern  . Not on file   Social History Narrative  . Not on file   Social Determinants of Health   Financial Resource Strain:   . Difficulty of Paying Living Expenses: Not on file  Food Insecurity:   . Worried About Charity fundraiser in the Last Year: Not on file  . Ran Out of Food in the Last Year: Not on file  Transportation Needs:   . Lack of Transportation (Medical): Not on file  . Lack of Transportation (Non-Medical): Not on file  Physical Activity:   . Days of Exercise per Week: Not on file  . Minutes of Exercise per Session: Not on file  Stress:   . Feeling of Stress : Not on file  Social Connections:   . Frequency of Communication with Friends and Family: Not on file  . Frequency of Social Gatherings with Friends and Family: Not on file  . Attends Religious Services: Not on file  . Active Member of Clubs or Organizations: Not on file  . Attends Archivist Meetings: Not on file  . Marital Status: Not on file  Intimate Partner Violence:   . Fear of Current or Ex-Partner: Not on file  . Emotionally Abused: Not on file  . Physically Abused: Not on file  . Sexually Abused: Not on file     ROS- All systems are reviewed and negative except as per the HPI above.  Physical Exam: Vitals:   07/13/19 1357  BP: 118/64  Pulse: 100  Weight: 67.8 kg  Height: 5\' 7"  (1.702 m)    GEN- The patient is well appearing elderly female, alert and oriented x 3 today.   Head- normocephalic, atraumatic Eyes-  Sclera clear, conjunctiva pink Ears- hearing intact Oropharynx- clear Neck- supple  Lungs- Clear to ausculation bilaterally, normal work of breathing Heart- Regular rate and rhythm, frequent ectopic beats, no murmurs, rubs or gallops  GI- soft, NT, ND, + BS Extremities- no clubbing, cyanosis. Trace edema right lower extremity MS- no significant deformity or atrophy Skin- no rash or lesion Psych- euthymic mood, full affect Neuro- strength and sensation are intact  Wt Readings from Last  3 Encounters:  07/13/19 67.8 kg  04/04/19 71.5 kg  04/01/19 68 kg    EKG today demonstrates sinus tach HR 100, frequent PACs, PR 204, QRS 74, QTc 441  Echo 04/04/19 demonstrated   1. Left ventricular ejection fraction, by visual estimation, is 55 to 60%. The left ventricle has normal function. Normal left ventricular size. There is mildly increased left ventricular hypertrophy.  2. Left ventricular diastolic Doppler parameters are consistent with impaired relaxation pattern of LV diastolic filling.  3. Global right ventricle has normal systolic function.The right ventricular size is normal. No increase in right ventricular wall thickness.  4. Left atrial size was normal.  5. Right atrial size was normal.  6. Moderate aortic valve annular calcification.  7. The mitral valve is grossly normal. Trace  mitral valve regurgitation.  8. The tricuspid valve is abnormal. Tricuspid valve regurgitation moderate-severe.  9. The tricuspid valve is thickened and appears to incompletely coapt. 10. The aortic valve is tricuspid Aortic valve regurgitation was not visualized by color flow Doppler. 11. The pulmonic valve was grossly normal. Pulmonic valve regurgitation is trivial by color flow Doppler. 12. Normal pulmonary artery systolic pressure. 13. The tricuspid regurgitant velocity is 2.52 m/s, and with an assumed right atrial pressure of 3 mmHg, the estimated right ventricular systolic pressure is normal at 28.4 mmHg. 14. The inferior vena cava is normal in size with greater than 50% respiratory variability, suggesting right atrial pressure of 3 mmHg.  Epic records are reviewed at length today  Labs from PCP 07/12/19 Cr 0.61, BUN 15, Na 128, K+4.5 Hgb 12.3, Hct 35.7, Plt 227 TSH 0.994  Assessment and Plan:  1. Disorganized atrial activity/PACs General education about afib provided and questions answered.  ECG today shows SR with PACs which is similar to the ECG done at the PCP office. No afib  clearly seen, however, given frequency of atrial ectopy, it is very possible she is having afib. Will order 7 day Zio patch to evaluate for arrhythmia and PAC burden.  If afib seen on monitor, will start Eliquis 5 mg BID Continue diltiazem 120 mg daily Continue Coreg 6.25 mg BID  This patients CHA2DS2-VASc Score and unadjusted Ischemic Stroke Rate (% per year) is equal to 4.8 % stroke rate/year from a score of 4  Above score calculated as 1 point each if present [CHF, HTN, DM, Vascular=MI/PAD/Aortic Plaque, Age if 65-74, or Female] Above score calculated as 2 points each if present [Age > 75, or Stroke/TIA/TE]   2. HTN Stable, no changes today.   Follow up in the AF clinic in 3 weeks.   [facility] [address] [place], [place] [address] [phone] 07/13/2019 3:40 PM

## 2019-08-03 ENCOUNTER — Other Ambulatory Visit: Payer: Self-pay

## 2019-08-03 ENCOUNTER — Ambulatory Visit (HOSPITAL_COMMUNITY)
Admission: RE | Admit: 2019-08-03 | Discharge: 2019-08-03 | Disposition: A | Discharge status: Home / Self Care | Payer: Medicare Other | Source: Ambulatory Visit | Attending: Physician Assistant | Admitting: Physician Assistant

## 2019-08-03 VITALS — BP 168/90 | HR 74 | Ht 67.0 in | Wt 150.8 lb

## 2019-08-03 DIAGNOSIS — I491 Atrial premature depolarization: Secondary | ICD-10-CM

## 2019-08-03 DIAGNOSIS — I4891 Unspecified atrial fibrillation: Secondary | ICD-10-CM | POA: Diagnosis not present

## 2019-08-03 DIAGNOSIS — D649 Anemia, unspecified: Secondary | ICD-10-CM | POA: Diagnosis not present

## 2019-08-03 DIAGNOSIS — Z7901 Long term (current) use of anticoagulants: Secondary | ICD-10-CM | POA: Insufficient documentation

## 2019-08-03 DIAGNOSIS — I82409 Acute embolism and thrombosis of unspecified deep veins of unspecified lower extremity: Secondary | ICD-10-CM | POA: Insufficient documentation

## 2019-08-03 DIAGNOSIS — I10 Essential (primary) hypertension: Secondary | ICD-10-CM | POA: Diagnosis not present

## 2019-08-03 DIAGNOSIS — Z79899 Other long term (current) drug therapy: Secondary | ICD-10-CM | POA: Diagnosis not present

## 2019-08-03 NOTE — Progress Notes (Signed)
Primary Care Physician: Lajean Manes, MD Primary Cardiologist: none Primary Electrophysiologist: none Referring Physician: Dr Wendall Papa Michelle Mosley is a 86 y.o. female with a history of HTN, anemia, and new onset atrial fibrillation who presents for follow up in the [facility].  The patient was initially diagnosed with atrial fibrillation on 07/12/19 at her PCP office after presenting with symptoms of heart racing. ECG read as afib with RVR. She was prescribed Eliquis for a CHADS2VASC score of 4 and her amlodipine was changed to diltiazem. Patient reports that her heart rate has been in the 70s-80s at home since the medication change. She has not started the Eliquis yet. She denies snoring or significant alcohol use. She does report that she has had several episodes of diarrhea prior to this episode.  On follow up today, patient reports she has done very well since her last visit. The Zio patch showed no afib with frequent PACs and SVT. She remains very active by walking daily.  Today, she denies symptoms of palpitations, chest pain, shortness of breath, orthopnea, PND, lower extremity edema, dizziness, presyncope, syncope, snoring, daytime somnolence, bleeding, or neurologic sequela. The patient is tolerating medications without difficulties and is otherwise without complaint today.    Atrial Fibrillation Risk Factors:  she does not have symptoms or diagnosis of sleep apnea. she does not have a history of rheumatic fever. she does not have a history of alcohol use. The patient does not have a history of early familial atrial fibrillation or other arrhythmias.  she has a BMI of Body mass index is 23.62 kg/m.Marland Kitchen Filed Weights   08/03/19 1553  Weight: 68.4 kg    Family History  Problem Relation Age of Onset  . Colon cancer Mother      Atrial Fibrillation Management history:  Previous antiarrhythmic drugs: none Previous cardioversions:  none Previous ablations: none CHADS2VASC score: 4 Anticoagulation history: none   Past Medical History:  Diagnosis Date  . Arthritis   . DVT (deep venous thrombosis) ([place])   . Hypertension    Past Surgical History:  Procedure Laterality Date  . broken neck  1976  . [place]  . excision of breast cyst Left   . TOTAL KNEE ARTHROPLASTY Left 04/01/2019   Procedure: TOTAL KNEE ARTHROPLASTY;  Surgeon: [name], MD;  Location: [facility];  Service: Orthopedics;  Laterality: Left;  70 mins    Current Outpatient Medications  Medication Sig Dispense Refill  . carvedilol (COREG) 6.25 MG tablet Take 6.25 mg by mouth 2 (two) times daily with a meal.    . Cholecalciferol (VITAMIN D) 2000 units CAPS Take by mouth daily.    Marland Kitchen diltiazem (CARDIZEM) 120 MG tablet TAKE AS DIRECTED FOR 30 DAYS    . metroNIDAZOLE (METROCREAM) 0.75 % cream as needed.     . moxifloxacin (VIGAMOX) 0.5 % ophthalmic solution moxifloxacin 0.5 % eye drops    . polyethylene glycol (MIRALAX / GLYCOLAX) 17 g packet Take 17 g by mouth 2 (two) times daily. (Patient taking differently: Take 17 g by mouth daily. ) 28 packet 0  . sodium chloride ([employer]S SODIUM CHLORIDE) 5 % ophthalmic solution Place 1 drop into the right eye every 4 (four) hours as needed for eye irritation.    . valsartan-hydrochlorothiazide (DIOVAN-HCT) 320-12.5 MG per tablet Take 1 tablet by mouth daily.     No current facility-administered medications for this encounter.    No Known Allergies  Social History  Socioeconomic History  . Marital status: Widowed    Spouse name: Not on file  . Number of children: Not on file  . Years of education: Not on file  . Highest education level: Not on file  Occupational History  . Not on file  Tobacco Use  . Smoking status: Never Smoker  . Smokeless tobacco: Never Used  Substance and Sexual Activity  . Alcohol use: No  . Drug use: Never  . Sexual activity: Not on file  Other Topics Concern   . Not on file  Social History Narrative  . Not on file   Social Determinants of Health   Financial Resource Strain:   . Difficulty of Paying Living Expenses: Not on file  Food Insecurity:   . Worried About Charity fundraiser in the Last Year: Not on file  . Ran Out of Food in the Last Year: Not on file  Transportation Needs:   . Lack of Transportation (Medical): Not on file  . Lack of Transportation (Non-Medical): Not on file  Physical Activity:   . Days of Exercise per Week: Not on file  . Minutes of Exercise per Session: Not on file  Stress:   . Feeling of Stress : Not on file  Social Connections:   . Frequency of Communication with Friends and Family: Not on file  . Frequency of Social Gatherings with Friends and Family: Not on file  . Attends Religious Services: Not on file  . Active Member of Clubs or Organizations: Not on file  . Attends Archivist Meetings: Not on file  . Marital Status: Not on file  Intimate Partner Violence:   . Fear of Current or Ex-Partner: Not on file  . Emotionally Abused: Not on file  . Physically Abused: Not on file  . Sexually Abused: Not on file     ROS- All systems are reviewed and negative except as per the HPI above.  Physical Exam: Vitals:   08/03/19 1553  BP: (!) 168/90  Pulse: 74  Weight: 68.4 kg  Height: 5\' 7"  (1.702 m)    GEN- The patient is well appearing elderly female, alert and oriented x 3 today.   HEENT-head normocephalic, atraumatic, sclera clear, conjunctiva pink, hearing intact, trachea midline. Lungs- Clear to ausculation bilaterally, normal work of breathing Heart- Regular rate and rhythm, no murmurs, rubs or gallops  GI- soft, NT, ND, + BS Extremities- no clubbing, cyanosis. Non pitting edema RLE MS- no significant deformity or atrophy Skin- no rash or lesion Psych- euthymic mood, full affect Neuro- strength and sensation are intact   Wt Readings from Last 3 Encounters:  08/03/19 68.4 kg   07/13/19 67.8 kg  04/04/19 71.5 kg    EKG today demonstrates SR HR 74, PR 190, QRS 74, QTc 384  Echo 04/04/19 demonstrated   1. Left ventricular ejection fraction, by visual estimation, is 55 to 60%. The left ventricle has normal function. Normal left ventricular size. There is mildly increased left ventricular hypertrophy.  2. Left ventricular diastolic Doppler parameters are consistent with impaired relaxation pattern of LV diastolic filling.  3. Global right ventricle has normal systolic function.The right ventricular size is normal. No increase in right ventricular wall thickness.  4. Left atrial size was normal.  5. Right atrial size was normal.  6. Moderate aortic valve annular calcification.  7. The mitral valve is grossly normal. Trace mitral valve regurgitation.  8. The tricuspid valve is abnormal. Tricuspid valve regurgitation moderate-severe.  9. The  tricuspid valve is thickened and appears to incompletely coapt. 10. The aortic valve is tricuspid Aortic valve regurgitation was not visualized by color flow Doppler. 11. The pulmonic valve was grossly normal. Pulmonic valve regurgitation is trivial by color flow Doppler. 12. Normal pulmonary artery systolic pressure. 13. The tricuspid regurgitant velocity is 2.52 m/s, and with an assumed right atrial pressure of 3 mmHg, the estimated right ventricular systolic pressure is normal at 28.4 mmHg. 14. The inferior vena cava is normal in size with greater than 50% respiratory variability, suggesting right atrial pressure of 3 mmHg.  Epic records are reviewed at length today  Labs from PCP 07/12/19 Cr 0.61, BUN 15, Na 128, K+4.5 Hgb 12.3, Hct 35.7, Plt 227 TSH 0.994  Assessment and Plan:  1. Disorganized atrial activity/PACs/SVT Zio patch showed no afib but did show brief, frequent SVT and PACs. Will not start anticoagulation at this time. Discussed increasing diltiazem to 180 mg and patient prefers to discuss with PCP first. She  has an appointment next week. Continue Coreg 6.25 mg BID  This patients CHA2DS2-VASc Score and unadjusted Ischemic Stroke Rate (% per year) is equal to 4.8 % stroke rate/year from a score of 4  Above score calculated as 1 point each if present [CHF, HTN, DM, Vascular=MI/PAD/Aortic Plaque, Age if 65-74, or Female] Above score calculated as 2 points each if present [Age > 75, or Stroke/TIA/TE]   2. HTN Elevated today wit borderline readings at home. Plans as above.   Follow up in the AF clinic as needed.   [facility] [address] [place], [place] [address] [phone] 08/03/2019 4:31 PM

## 2019-08-10 ENCOUNTER — Ambulatory Visit: Payer: 59

## 2020-03-03 ENCOUNTER — Ambulatory Visit
Admission: RE | Admit: 2020-03-03 | Discharge: 2020-03-03 | Disposition: A | Discharge status: Home / Self Care | Payer: Medicare Other | Source: Ambulatory Visit | Attending: Geriatric Medicine | Admitting: Geriatric Medicine

## 2020-03-03 ENCOUNTER — Other Ambulatory Visit: Payer: Self-pay | Admitting: Geriatric Medicine

## 2020-03-03 DIAGNOSIS — R05 Cough: Secondary | ICD-10-CM

## 2020-03-03 DIAGNOSIS — R059 Cough, unspecified: Secondary | ICD-10-CM

## 2020-04-27 ENCOUNTER — Other Ambulatory Visit: Payer: Self-pay | Admitting: Gastroenterology

## 2020-04-27 DIAGNOSIS — R1033 Periumbilical pain: Secondary | ICD-10-CM

## 2020-04-27 DIAGNOSIS — K5901 Slow transit constipation: Secondary | ICD-10-CM

## 2020-05-12 ENCOUNTER — Ambulatory Visit
Admission: RE | Admit: 2020-05-12 | Discharge: 2020-05-12 | Disposition: A | Discharge status: Home / Self Care | Payer: Medicare Other | Source: Ambulatory Visit | Attending: Gastroenterology | Admitting: Gastroenterology

## 2020-05-12 DIAGNOSIS — K5901 Slow transit constipation: Secondary | ICD-10-CM

## 2020-05-12 DIAGNOSIS — R1033 Periumbilical pain: Secondary | ICD-10-CM

## 2020-05-12 MED ORDER — IOPAMIDOL (ISOVUE-300) INJECTION 61%
100.0000 mL | Freq: Once | INTRAVENOUS | Status: AC | PRN
  Administered 2020-05-12: 100 mL via INTRAVENOUS

## 2020-07-18 DIAGNOSIS — D225 Melanocytic nevi of trunk: Secondary | ICD-10-CM | POA: Diagnosis not present

## 2020-07-18 DIAGNOSIS — Z85828 Personal history of other malignant neoplasm of skin: Secondary | ICD-10-CM | POA: Diagnosis not present

## 2020-07-18 DIAGNOSIS — L905 Scar conditions and fibrosis of skin: Secondary | ICD-10-CM | POA: Diagnosis not present

## 2020-07-18 DIAGNOSIS — L821 Other seborrheic keratosis: Secondary | ICD-10-CM | POA: Diagnosis not present

## 2020-07-18 DIAGNOSIS — L820 Inflamed seborrheic keratosis: Secondary | ICD-10-CM | POA: Diagnosis not present

## 2020-07-18 DIAGNOSIS — L570 Actinic keratosis: Secondary | ICD-10-CM | POA: Diagnosis not present

## 2020-07-18 DIAGNOSIS — L718 Other rosacea: Secondary | ICD-10-CM | POA: Diagnosis not present

## 2020-07-21 DIAGNOSIS — K219 Gastro-esophageal reflux disease without esophagitis: Secondary | ICD-10-CM | POA: Diagnosis not present

## 2020-07-21 DIAGNOSIS — I10 Essential (primary) hypertension: Secondary | ICD-10-CM | POA: Diagnosis not present

## 2020-07-21 DIAGNOSIS — E7800 Pure hypercholesterolemia, unspecified: Secondary | ICD-10-CM | POA: Diagnosis not present

## 2020-07-21 DIAGNOSIS — I480 Paroxysmal atrial fibrillation: Secondary | ICD-10-CM | POA: Diagnosis not present

## 2020-08-16 DIAGNOSIS — I480 Paroxysmal atrial fibrillation: Secondary | ICD-10-CM | POA: Diagnosis not present

## 2020-08-16 DIAGNOSIS — E7800 Pure hypercholesterolemia, unspecified: Secondary | ICD-10-CM | POA: Diagnosis not present

## 2020-08-16 DIAGNOSIS — I10 Essential (primary) hypertension: Secondary | ICD-10-CM | POA: Diagnosis not present

## 2020-08-16 DIAGNOSIS — K219 Gastro-esophageal reflux disease without esophagitis: Secondary | ICD-10-CM | POA: Diagnosis not present

## 2020-08-22 DIAGNOSIS — I700 Atherosclerosis of aorta: Secondary | ICD-10-CM | POA: Diagnosis not present

## 2020-08-22 DIAGNOSIS — Z1389 Encounter for screening for other disorder: Secondary | ICD-10-CM | POA: Diagnosis not present

## 2020-08-22 DIAGNOSIS — Z0000 Encounter for general adult medical examination without abnormal findings: Secondary | ICD-10-CM | POA: Diagnosis not present

## 2020-08-22 DIAGNOSIS — I480 Paroxysmal atrial fibrillation: Secondary | ICD-10-CM | POA: Diagnosis not present

## 2020-08-22 DIAGNOSIS — K219 Gastro-esophageal reflux disease without esophagitis: Secondary | ICD-10-CM | POA: Diagnosis not present

## 2020-08-22 DIAGNOSIS — M25561 Pain in right knee: Secondary | ICD-10-CM | POA: Diagnosis not present

## 2020-08-22 DIAGNOSIS — Z79899 Other long term (current) drug therapy: Secondary | ICD-10-CM | POA: Diagnosis not present

## 2020-08-22 DIAGNOSIS — I10 Essential (primary) hypertension: Secondary | ICD-10-CM | POA: Diagnosis not present

## 2020-08-28 DIAGNOSIS — N390 Urinary tract infection, site not specified: Secondary | ICD-10-CM | POA: Diagnosis not present

## 2020-08-31 DIAGNOSIS — R3915 Urgency of urination: Secondary | ICD-10-CM | POA: Diagnosis not present

## 2020-08-31 DIAGNOSIS — R350 Frequency of micturition: Secondary | ICD-10-CM | POA: Diagnosis not present

## 2020-09-13 DIAGNOSIS — E7800 Pure hypercholesterolemia, unspecified: Secondary | ICD-10-CM | POA: Diagnosis not present

## 2020-09-13 DIAGNOSIS — K219 Gastro-esophageal reflux disease without esophagitis: Secondary | ICD-10-CM | POA: Diagnosis not present

## 2020-09-13 DIAGNOSIS — I10 Essential (primary) hypertension: Secondary | ICD-10-CM | POA: Diagnosis not present

## 2020-09-13 DIAGNOSIS — I480 Paroxysmal atrial fibrillation: Secondary | ICD-10-CM | POA: Diagnosis not present

## 2020-09-18 DIAGNOSIS — R300 Dysuria: Secondary | ICD-10-CM | POA: Diagnosis not present

## 2020-09-18 DIAGNOSIS — Z79899 Other long term (current) drug therapy: Secondary | ICD-10-CM | POA: Diagnosis not present

## 2020-10-20 DIAGNOSIS — I480 Paroxysmal atrial fibrillation: Secondary | ICD-10-CM | POA: Diagnosis not present

## 2020-10-20 DIAGNOSIS — I10 Essential (primary) hypertension: Secondary | ICD-10-CM | POA: Diagnosis not present

## 2020-10-20 DIAGNOSIS — K219 Gastro-esophageal reflux disease without esophagitis: Secondary | ICD-10-CM | POA: Diagnosis not present

## 2020-10-20 DIAGNOSIS — E7800 Pure hypercholesterolemia, unspecified: Secondary | ICD-10-CM | POA: Diagnosis not present

## 2020-11-13 DIAGNOSIS — E7800 Pure hypercholesterolemia, unspecified: Secondary | ICD-10-CM | POA: Diagnosis not present

## 2020-11-13 DIAGNOSIS — I480 Paroxysmal atrial fibrillation: Secondary | ICD-10-CM | POA: Diagnosis not present

## 2020-11-13 DIAGNOSIS — I10 Essential (primary) hypertension: Secondary | ICD-10-CM | POA: Diagnosis not present

## 2020-11-13 DIAGNOSIS — K219 Gastro-esophageal reflux disease without esophagitis: Secondary | ICD-10-CM | POA: Diagnosis not present

## 2020-11-17 DIAGNOSIS — L570 Actinic keratosis: Secondary | ICD-10-CM | POA: Diagnosis not present

## 2020-11-17 DIAGNOSIS — L820 Inflamed seborrheic keratosis: Secondary | ICD-10-CM | POA: Diagnosis not present

## 2020-11-19 IMAGING — CT CT HEAD W/O CM
3 series · 16 of 47 positions shown, 19 images · non-contrast
Comparison: None.

CLINICAL DATA: Syncope. Status post knee replacement surgery on
[REDACTED].

EXAM:
CT HEAD WITHOUT CONTRAST
TECHNIQUE: Contiguous axial images were obtained from the base of the skull
through the vertex without intravenous contrast.

[Series 2: head wo · axial · 0.47mm/px · z∈[+1693,+1833]mm · 10 of 34 slices shown, 13 images]
[im 3/34  brain]
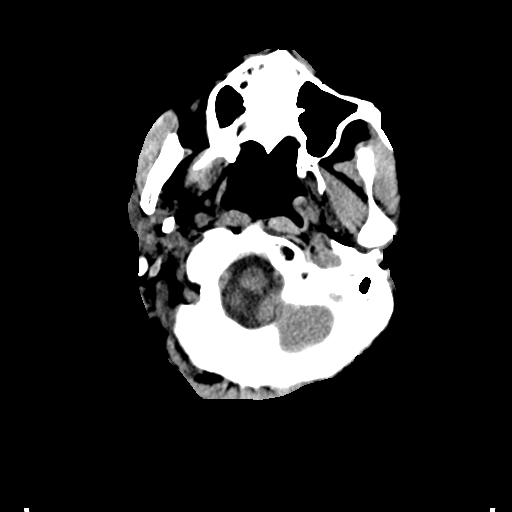
[im 3/34  bone]
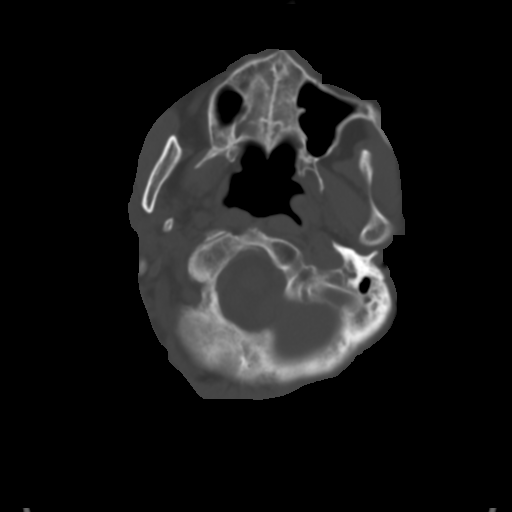
[im 6/34  brain]
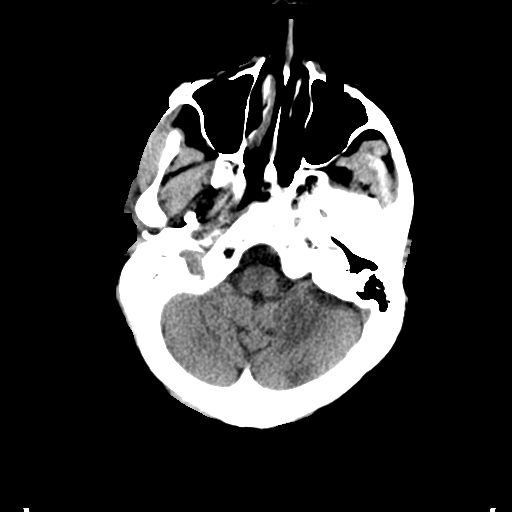
[im 10/34  brain]
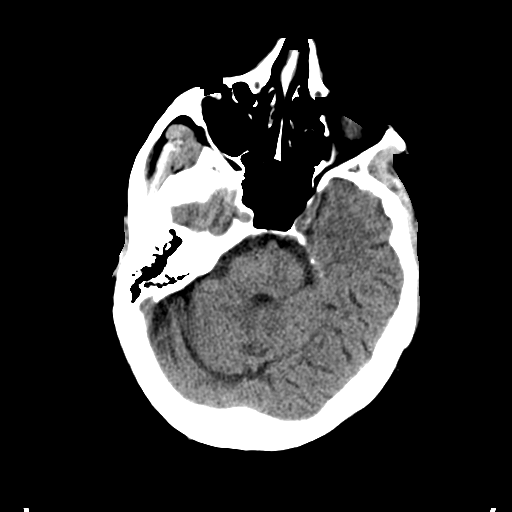
[im 12/34  brain]
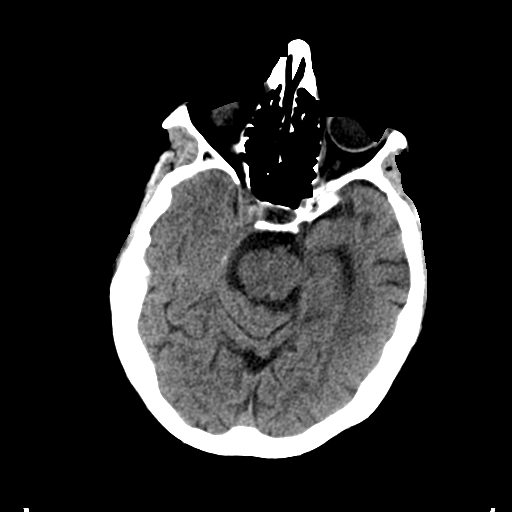
[im 15/34  brain]
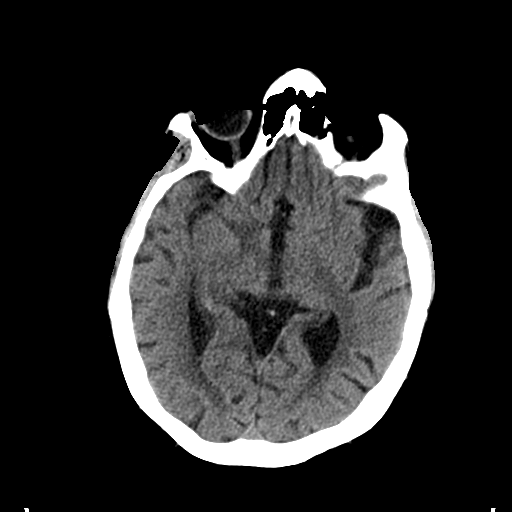
[im 15/34  bone]
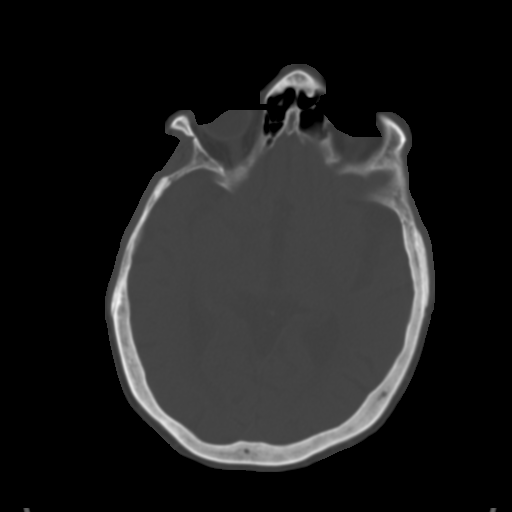
[im 19/34  brain]
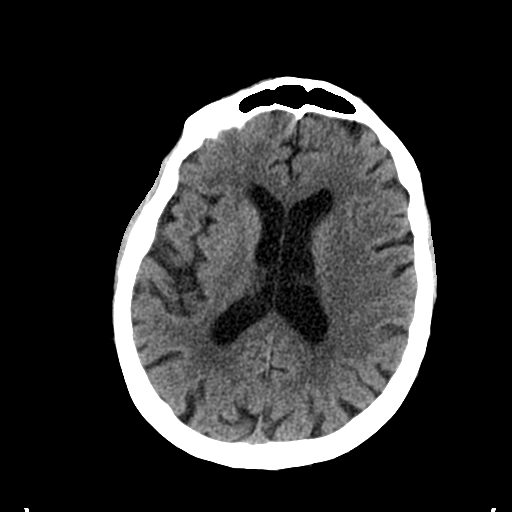
[im 22/34  brain]
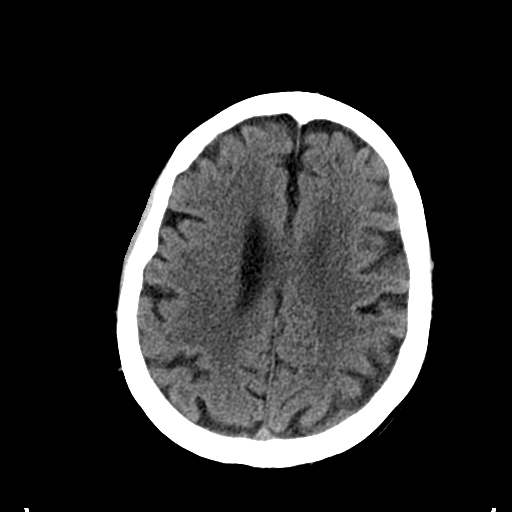
[im 26/34  brain]
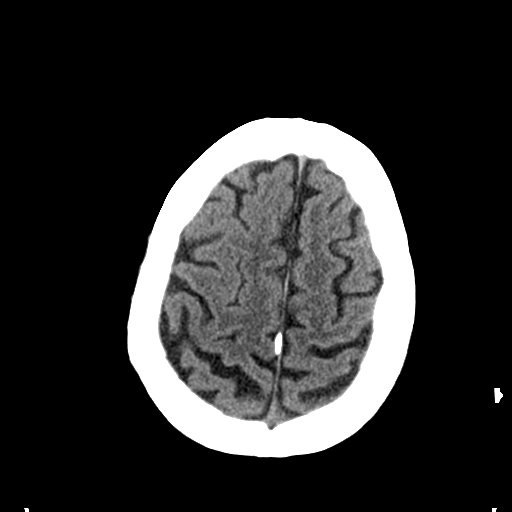
[im 28/34  brain]
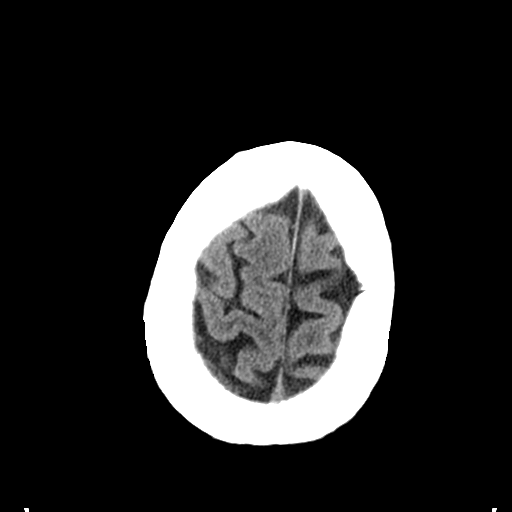
[im 28/34  bone]
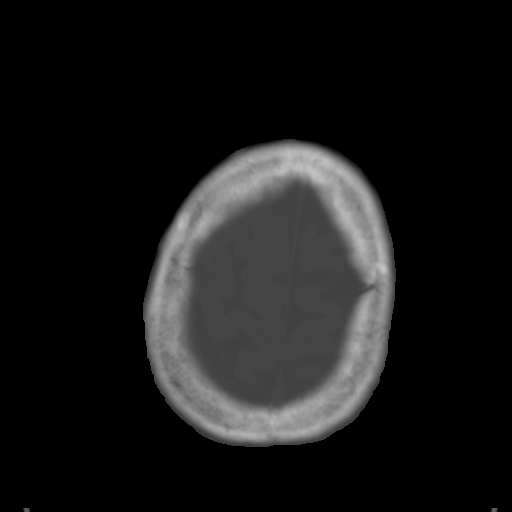
[im 31/34  brain]
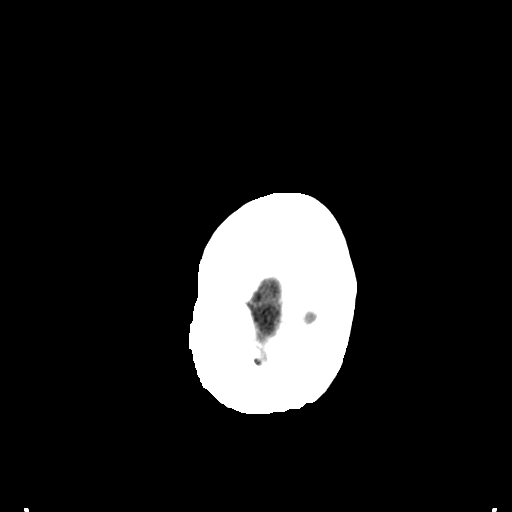

[Series 5: coronal soft tissue · coronal · 0.33mm/px · 3 of 83 slices shown]
[im 28/83  brain]
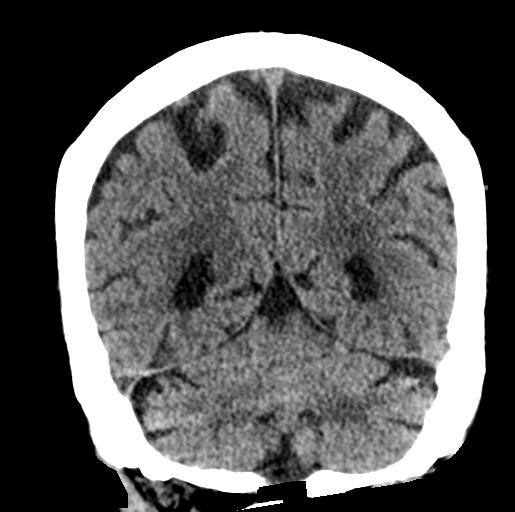
[im 37/83  brain]
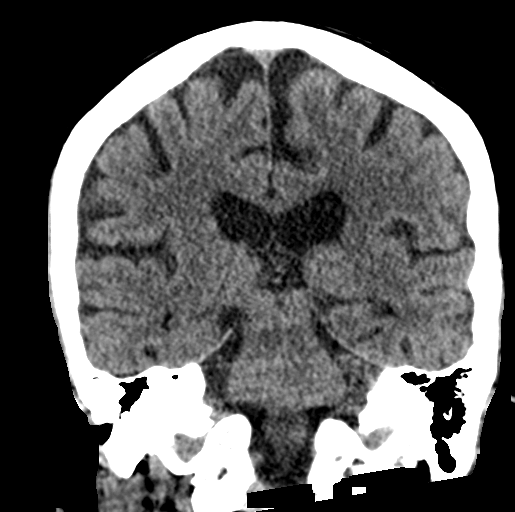
[im 46/83  brain]
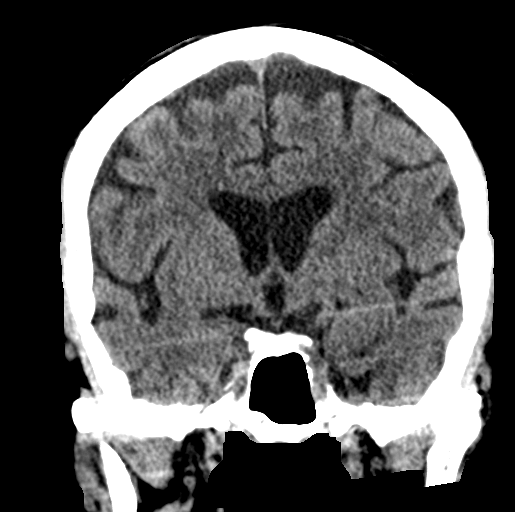

[Series 6: sagittal soft tissue · sagittal · 0.33mm/px · 3 of 51 slices shown]
[im 19/51  brain]
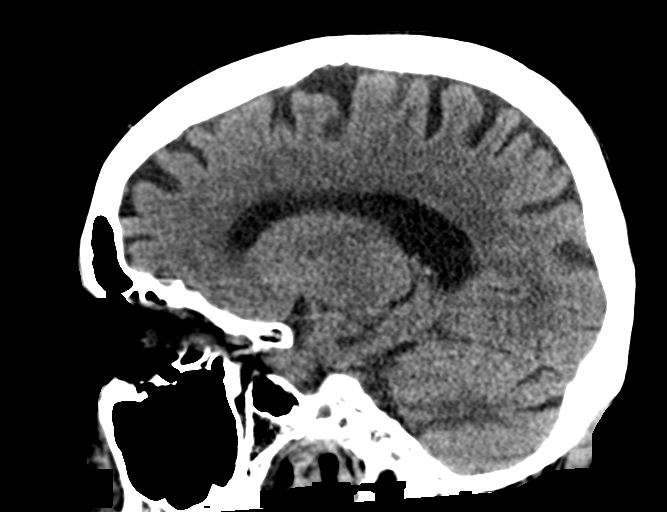
[im 26/51  brain]
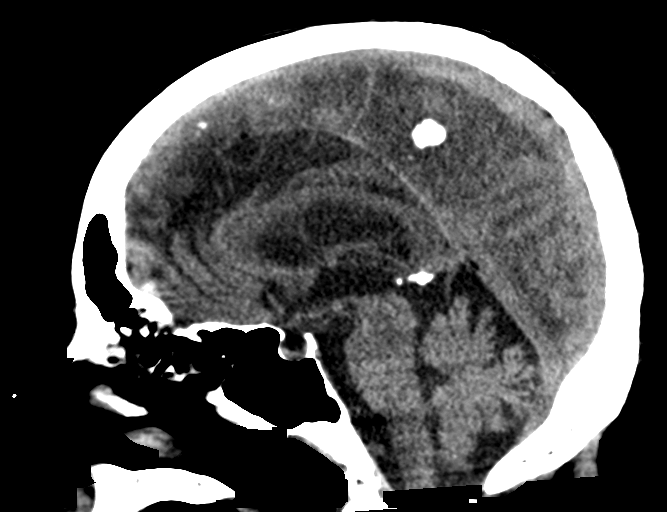
[im 32/51  brain]
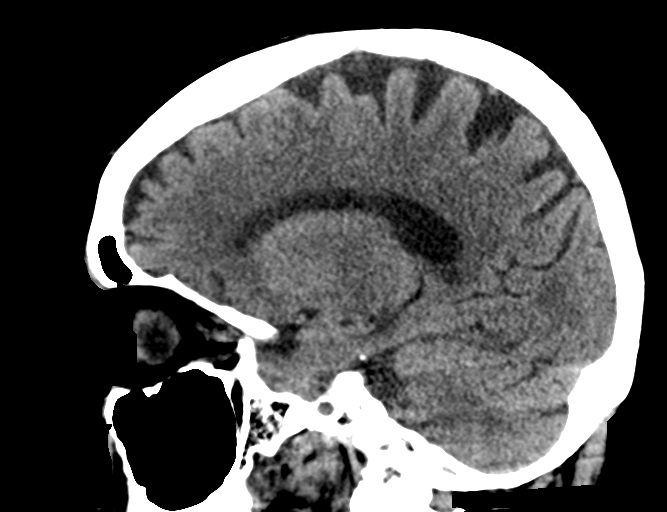

[16 of 47 positions shown; findings below may reference images not displayed]

FINDINGS: Brain: Mild generalized age related parenchymal volume loss with
commensurate dilatation of the ventricles and sulci. No mass,
hemorrhage, edema or other evidence of acute parenchymal
abnormality. No extra-axial hemorrhage.

Vascular: No hyperdense vessel or unexpected calcification.

Skull: Normal. Negative for fracture or focal lesion.

Sinuses/Orbits: No acute finding.

Other: None.
IMPRESSION: Negative head CT. No intracranial mass, hemorrhage or edema.

## 2020-11-20 DIAGNOSIS — H52211 Irregular astigmatism, right eye: Secondary | ICD-10-CM | POA: Diagnosis not present

## 2020-11-20 DIAGNOSIS — Z961 Presence of intraocular lens: Secondary | ICD-10-CM | POA: Diagnosis not present

## 2020-11-20 DIAGNOSIS — H04123 Dry eye syndrome of bilateral lacrimal glands: Secondary | ICD-10-CM | POA: Diagnosis not present

## 2020-11-20 DIAGNOSIS — H1811 Bullous keratopathy, right eye: Secondary | ICD-10-CM | POA: Diagnosis not present

## 2020-12-18 DIAGNOSIS — R3915 Urgency of urination: Secondary | ICD-10-CM | POA: Diagnosis not present

## 2020-12-18 DIAGNOSIS — R3914 Feeling of incomplete bladder emptying: Secondary | ICD-10-CM | POA: Diagnosis not present

## 2020-12-18 DIAGNOSIS — N3020 Other chronic cystitis without hematuria: Secondary | ICD-10-CM | POA: Diagnosis not present

## 2020-12-21 DIAGNOSIS — I480 Paroxysmal atrial fibrillation: Secondary | ICD-10-CM | POA: Diagnosis not present

## 2020-12-21 DIAGNOSIS — I10 Essential (primary) hypertension: Secondary | ICD-10-CM | POA: Diagnosis not present

## 2020-12-21 DIAGNOSIS — E7800 Pure hypercholesterolemia, unspecified: Secondary | ICD-10-CM | POA: Diagnosis not present

## 2020-12-21 DIAGNOSIS — K219 Gastro-esophageal reflux disease without esophagitis: Secondary | ICD-10-CM | POA: Diagnosis not present

## 2021-01-01 DIAGNOSIS — N3020 Other chronic cystitis without hematuria: Secondary | ICD-10-CM | POA: Diagnosis not present

## 2021-01-12 DIAGNOSIS — L718 Other rosacea: Secondary | ICD-10-CM | POA: Diagnosis not present

## 2021-01-12 DIAGNOSIS — L820 Inflamed seborrheic keratosis: Secondary | ICD-10-CM | POA: Diagnosis not present

## 2021-01-12 DIAGNOSIS — C44729 Squamous cell carcinoma of skin of left lower limb, including hip: Secondary | ICD-10-CM | POA: Diagnosis not present

## 2021-01-12 DIAGNOSIS — L570 Actinic keratosis: Secondary | ICD-10-CM | POA: Diagnosis not present

## 2021-01-12 DIAGNOSIS — D485 Neoplasm of uncertain behavior of skin: Secondary | ICD-10-CM | POA: Diagnosis not present

## 2021-01-24 DIAGNOSIS — C44729 Squamous cell carcinoma of skin of left lower limb, including hip: Secondary | ICD-10-CM | POA: Diagnosis not present

## 2021-01-29 ENCOUNTER — Ambulatory Visit (INDEPENDENT_AMBULATORY_CARE_PROVIDER_SITE_OTHER): Payer: Medicare Other | Admitting: Ophthalmology

## 2021-01-29 ENCOUNTER — Encounter (INDEPENDENT_AMBULATORY_CARE_PROVIDER_SITE_OTHER): Payer: Self-pay | Admitting: Ophthalmology

## 2021-01-29 ENCOUNTER — Other Ambulatory Visit: Payer: Self-pay

## 2021-01-29 DIAGNOSIS — H18231 Secondary corneal edema, right eye: Secondary | ICD-10-CM

## 2021-01-29 DIAGNOSIS — H43812 Vitreous degeneration, left eye: Secondary | ICD-10-CM

## 2021-01-29 NOTE — Progress Notes (Signed)
01/29/2021     CHIEF COMPLAINT Patient presents for Retina Evaluation (WIP- Pt wants 2nd opinion per Dr. Piedad Climes states, "I have been seeing Dr. Loma Sousa and I am not able to see anything in OD hardly at all anymore. The last thing she told was that she was unable to do anything else for me other than an eye transplant. I have blurriness and constant watering. I just want someone else to look at me and Dr. Zadie Rhine has done surgery on me in the past.")   HISTORY OF PRESENT ILLNESS: Michelle Mosley is a 87 y.o. female who presents to the clinic today for:   HPI     Retina Evaluation           Laterality: right eye   Onset: 6 months ago   Duration: 6 months   Associated Symptoms: Redness and Photophobia   Context: distance vision, mid-range vision and near vision   Comments: WIP- Pt wants 2nd opinion per Dr. Loma Sousa Pt states, "I have been seeing Dr. Loma Sousa and I am not able to see anything in OD hardly at all anymore. The last thing she told was that she was unable to do anything else for me other than an eye transplant. I have blurriness and constant watering. I just want someone else to look at me and Dr. Zadie Rhine has done surgery on me in the past."       Last edited by Kendra Opitz, COA on 01/29/2021  2:34 PM.      Referring physician: Lajean Manes, [place] [place],  [place] [address]  HISTORICAL INFORMATION:   Selected notes from the MEDICAL RECORD NUMBER       CURRENT MEDICATIONS: Current Outpatient Medications (Ophthalmic Drugs)  Medication Sig   moxifloxacin (VIGAMOX) 0.5 % ophthalmic solution moxifloxacin 0.5 % eye drops   sodium chloride ([employer] SODIUM CHLORIDE) 5 % ophthalmic solution Place 1 drop into the right eye every 4 (four) hours as needed for eye irritation.   No current facility-administered medications for this visit. (Ophthalmic Drugs)   Current Outpatient Medications (Other)  Medication Sig   carvedilol (COREG) 6.25 MG tablet Take  6.25 mg by mouth 2 (two) times daily with a meal.   Cholecalciferol (VITAMIN D) 2000 units CAPS Take by mouth daily.   diltiazem (CARDIZEM) 120 MG tablet TAKE AS DIRECTED FOR 30 DAYS   metroNIDAZOLE (METROCREAM) 0.75 % cream as needed.    polyethylene glycol (MIRALAX / GLYCOLAX) 17 g packet Take 17 g by mouth 2 (two) times daily. (Patient taking differently: Take 17 g by mouth daily. )   valsartan-hydrochlorothiazide (DIOVAN-HCT) 320-12.5 MG per tablet Take 1 tablet by mouth daily.   No current facility-administered medications for this visit. (Other)      REVIEW OF SYSTEMS:    ALLERGIES No Known Allergies  PAST MEDICAL HISTORY Past Medical History:  Diagnosis Date   Arthritis    DVT (deep venous thrombosis) ([place])    Hypertension    Past Surgical History:  Procedure Laterality Date   broken neck  [place]   excision of breast cyst Left    TOTAL KNEE ARTHROPLASTY Left 04/01/2019   Procedure: TOTAL KNEE ARTHROPLASTY;  Surgeon: [name], MD;  Location: [facility];  Service: Orthopedics;  Laterality: Left;  70 mins    FAMILY HISTORY Family History  Problem Relation Age of Onset   Colon cancer Mother     SOCIAL  HISTORY Social History   Tobacco Use   Smoking status: Never   Smokeless tobacco: Never  Vaping Use   Vaping Use: Never used  Substance Use Topics   Alcohol use: No   Drug use: Never         OPHTHALMIC EXAM:  Base Eye Exam     Visual Acuity (ETDRS)       Right Left   Dist [place] CF at 3' 20/20 -1    Correction: Glasses         Tonometry (Tonopen, 2:37 PM)       Right Left   Pressure 09 10         Pupils       Pupils Dark Light Shape React APD   Right PERRL 3 2 Round Brisk None   Left PERRL 3 2 Round Brisk None         Visual Fields (Counting fingers)       Left Right    Full Full         Extraocular Movement       Right Left    Full Full         Neuro/Psych     Oriented x3: Yes          Dilation     Both eyes: 1.0% Mydriacyl, 2.5% Phenylephrine @ 2:37 PM           Slit Lamp and Fundus Exam     External Exam       Right Left   External Normal Normal         Slit Lamp Exam       Right Left   Lids/Lashes Normal Normal   Conjunctiva/Sclera White and quiet White and quiet   Cornea Epithelial edema, 2+ Descemet's folds, stromal haze, no infiltrate Clear   Anterior Chamber Deep and quiet Deep and quiet   Iris Round and reactive Round and reactive   Lens Posterior chamber intraocular lens Posterior chamber intraocular lens   Anterior Vitreous Normal Normal         Fundus Exam       Right Left   Posterior Vitreous Normal Normal   Disc Normal Normal   C/D Ratio no details 0.5   Macula Normal Normal   Vessels Normal Normal   Periphery Normal Normal            IMAGING AND PROCEDURES  Imaging and Procedures for 01/29/21  OCT, Retina - OU - Both Eyes       Right Eye Quality was poor.   Left Eye Quality was good. Scan locations included subfoveal. Central Foveal Thickness: 274. Progression has been stable. Findings include normal foveal contour.   Notes OD, NO VIEWS THRU THE CORNEA.  April 2019 no significant drusen deposits and normal Foveal thickness at that time of the right eye.  OS no interval changes compared to 2019 no significant drusen deposit deposition and normal foveal thickness and topography               ASSESSMENT/PLAN:  Corneal edema, secondary, right Pseudophakic bullous keratopathy right eye.  Epithelial bullae likely to develop infection but no infiltrates at this time.  Certainly accounts for patient's continued foreign body sensation as well as particularly cloudy vision.  Dr. Patrice Paradise is appropriately summarized the patient's condition and her need for corneal expertise evaluation either [facility] or perhaps [facility] for consideration Dsek or similar endothelial transplantation procedure or  possible  PK P.       ICD-10-CM   1. Posterior vitreous detachment, left eye  H43.812 OCT, Retina - OU - Both Eyes    2. Corneal edema, secondary, right  H18.231       1.  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  Patient was 20/50 after repositioning of dislocated intraocular lens in 2019 OD  2.  Follow-up with completely Dr. Annia Belt and I do recommend follow-up corneal evaluation and consideration of endothelial transplant of the cornea right eye.  3.  Ophthalmic Meds Ordered this visit:  No orders of the defined types were placed in this encounter.      Return for Follow-up entirely with Dr. Annia Belt for coordination of care.  There are no Patient Instructions on file for this visit.   Explained the diagnoses, plan, and follow up with the patient and they expressed understanding.  Patient expressed understanding of the importance of proper follow up care.   Clent Demark Arliene Rosenow M.D. Diseases & Surgery of the Retina and Vitreous [facility] [place] 01/29/21     Abbreviations: M myopia (nearsighted); A astigmatism; H hyperopia (farsighted); P presbyopia; Mrx spectacle prescription;  CTL contact lenses; OD right eye; OS left eye; OU both eyes  XT exotropia; ET esotropia; PEK punctate epithelial keratitis; PEE punctate epithelial erosions; DES dry eye syndrome; MGD meibomian gland dysfunction; ATs artificial tears; PFAT's preservative free artificial tears; [place] nuclear sclerotic cataract; PSC posterior subcapsular cataract; ERM epi-retinal membrane; PVD posterior vitreous detachment; RD retinal detachment; DM diabetes mellitus; DR diabetic retinopathy; NPDR non-proliferative diabetic retinopathy; PDR proliferative diabetic retinopathy; CSME clinically significant macular edema; DME diabetic macular edema; dbh dot blot hemorrhages; CWS cotton wool spot; POAG primary open angle glaucoma; C/D cup-to-disc ratio; HVF humphrey  visual field; GVF goldmann visual field; OCT optical coherence tomography; IOP intraocular pressure; BRVO Branch retinal vein occlusion; CRVO central retinal vein occlusion; CRAO central retinal artery occlusion; BRAO branch retinal artery occlusion; RT retinal tear; SB scleral buckle; PPV pars plana vitrectomy; VH Vitreous hemorrhage; PRP panretinal laser photocoagulation; IVK intravitreal kenalog; VMT vitreomacular traction; MH Macular hole;  NVD neovascularization of the disc; NVE neovascularization elsewhere; AREDS age related eye disease study; ARMD age related macular degeneration; POAG primary open angle glaucoma; EBMD epithelial/anterior basement membrane dystrophy; ACIOL anterior chamber intraocular lens; IOL intraocular lens; PCIOL posterior chamber intraocular lens; Phaco/IOL phacoemulsification with intraocular lens placement; [place] photorefractive keratectomy; LASIK laser assisted in situ keratomileusis; HTN hypertension; DM diabetes mellitus; COPD chronic obstructive pulmonary disease

## 2021-01-29 NOTE — Assessment & Plan Note (Signed)
Pseudophakic bullous keratopathy right eye.  Epithelial bullae likely to develop infection but no infiltrates at this time.  Certainly accounts for patient's continued foreign body sensation as well as particularly cloudy vision.  Dr. Patrice Paradise is appropriately summarized the patient's condition and her need for corneal expertise evaluation either [facility] or perhaps [facility] for consideration Dsek or similar endothelial transplantation procedure or possible PK P.

## 2021-02-01 DIAGNOSIS — I480 Paroxysmal atrial fibrillation: Secondary | ICD-10-CM | POA: Diagnosis not present

## 2021-02-01 DIAGNOSIS — E7800 Pure hypercholesterolemia, unspecified: Secondary | ICD-10-CM | POA: Diagnosis not present

## 2021-02-01 DIAGNOSIS — K219 Gastro-esophageal reflux disease without esophagitis: Secondary | ICD-10-CM | POA: Diagnosis not present

## 2021-02-01 DIAGNOSIS — I10 Essential (primary) hypertension: Secondary | ICD-10-CM | POA: Diagnosis not present

## 2021-02-07 DIAGNOSIS — D485 Neoplasm of uncertain behavior of skin: Secondary | ICD-10-CM | POA: Diagnosis not present

## 2021-02-16 DIAGNOSIS — L821 Other seborrheic keratosis: Secondary | ICD-10-CM | POA: Diagnosis not present

## 2021-02-16 DIAGNOSIS — C4442 Squamous cell carcinoma of skin of scalp and neck: Secondary | ICD-10-CM | POA: Diagnosis not present

## 2021-02-16 DIAGNOSIS — D485 Neoplasm of uncertain behavior of skin: Secondary | ICD-10-CM | POA: Diagnosis not present

## 2021-02-21 DIAGNOSIS — N3020 Other chronic cystitis without hematuria: Secondary | ICD-10-CM | POA: Diagnosis not present

## 2021-02-21 DIAGNOSIS — R3915 Urgency of urination: Secondary | ICD-10-CM | POA: Diagnosis not present

## 2021-02-24 ENCOUNTER — Encounter: Payer: Self-pay | Admitting: Student

## 2021-02-26 DIAGNOSIS — I10 Essential (primary) hypertension: Secondary | ICD-10-CM | POA: Diagnosis not present

## 2021-03-02 DIAGNOSIS — N3020 Other chronic cystitis without hematuria: Secondary | ICD-10-CM | POA: Diagnosis not present

## 2021-03-21 DIAGNOSIS — M1711 Unilateral primary osteoarthritis, right knee: Secondary | ICD-10-CM | POA: Diagnosis not present

## 2021-03-28 DIAGNOSIS — N3020 Other chronic cystitis without hematuria: Secondary | ICD-10-CM | POA: Diagnosis not present

## 2021-04-13 DIAGNOSIS — L570 Actinic keratosis: Secondary | ICD-10-CM | POA: Diagnosis not present

## 2021-04-13 DIAGNOSIS — D044 Carcinoma in situ of skin of scalp and neck: Secondary | ICD-10-CM | POA: Diagnosis not present

## 2021-04-26 DIAGNOSIS — H1811 Bullous keratopathy, right eye: Secondary | ICD-10-CM | POA: Diagnosis not present

## 2021-04-26 DIAGNOSIS — Z961 Presence of intraocular lens: Secondary | ICD-10-CM | POA: Diagnosis not present

## 2021-04-26 DIAGNOSIS — H52201 Unspecified astigmatism, right eye: Secondary | ICD-10-CM | POA: Diagnosis not present

## 2021-04-26 DIAGNOSIS — H04123 Dry eye syndrome of bilateral lacrimal glands: Secondary | ICD-10-CM | POA: Diagnosis not present

## 2021-05-11 DIAGNOSIS — Z23 Encounter for immunization: Secondary | ICD-10-CM | POA: Diagnosis not present

## 2021-05-15 DIAGNOSIS — Z08 Encounter for follow-up examination after completed treatment for malignant neoplasm: Secondary | ICD-10-CM | POA: Diagnosis not present

## 2021-05-15 DIAGNOSIS — L570 Actinic keratosis: Secondary | ICD-10-CM | POA: Diagnosis not present

## 2021-05-15 DIAGNOSIS — L814 Other melanin hyperpigmentation: Secondary | ICD-10-CM | POA: Diagnosis not present

## 2021-05-15 DIAGNOSIS — D485 Neoplasm of uncertain behavior of skin: Secondary | ICD-10-CM | POA: Diagnosis not present

## 2021-05-15 DIAGNOSIS — Z85828 Personal history of other malignant neoplasm of skin: Secondary | ICD-10-CM | POA: Diagnosis not present

## 2021-05-15 DIAGNOSIS — C4442 Squamous cell carcinoma of skin of scalp and neck: Secondary | ICD-10-CM | POA: Diagnosis not present

## 2021-05-25 DIAGNOSIS — R300 Dysuria: Secondary | ICD-10-CM | POA: Diagnosis not present

## 2021-06-25 DIAGNOSIS — M1711 Unilateral primary osteoarthritis, right knee: Secondary | ICD-10-CM | POA: Diagnosis not present

## 2021-07-25 DIAGNOSIS — Z961 Presence of intraocular lens: Secondary | ICD-10-CM | POA: Diagnosis not present

## 2021-07-25 DIAGNOSIS — H1811 Bullous keratopathy, right eye: Secondary | ICD-10-CM | POA: Diagnosis not present

## 2021-07-26 DIAGNOSIS — Z961 Presence of intraocular lens: Secondary | ICD-10-CM | POA: Diagnosis not present

## 2021-07-26 DIAGNOSIS — H04123 Dry eye syndrome of bilateral lacrimal glands: Secondary | ICD-10-CM | POA: Diagnosis not present

## 2021-07-26 DIAGNOSIS — Z48810 Encounter for surgical aftercare following surgery on the sense organs: Secondary | ICD-10-CM | POA: Diagnosis not present

## 2021-08-03 DIAGNOSIS — Z961 Presence of intraocular lens: Secondary | ICD-10-CM | POA: Diagnosis not present

## 2021-08-03 DIAGNOSIS — Z48810 Encounter for surgical aftercare following surgery on the sense organs: Secondary | ICD-10-CM | POA: Diagnosis not present

## 2021-08-03 DIAGNOSIS — H04123 Dry eye syndrome of bilateral lacrimal glands: Secondary | ICD-10-CM | POA: Diagnosis not present

## 2021-08-13 DIAGNOSIS — Z961 Presence of intraocular lens: Secondary | ICD-10-CM | POA: Diagnosis not present

## 2021-08-13 DIAGNOSIS — H04123 Dry eye syndrome of bilateral lacrimal glands: Secondary | ICD-10-CM | POA: Diagnosis not present

## 2021-08-13 DIAGNOSIS — Z48810 Encounter for surgical aftercare following surgery on the sense organs: Secondary | ICD-10-CM | POA: Diagnosis not present

## 2021-08-15 DIAGNOSIS — E222 Syndrome of inappropriate secretion of antidiuretic hormone: Secondary | ICD-10-CM | POA: Diagnosis not present

## 2021-08-15 DIAGNOSIS — R350 Frequency of micturition: Secondary | ICD-10-CM | POA: Diagnosis not present

## 2021-08-15 DIAGNOSIS — E7800 Pure hypercholesterolemia, unspecified: Secondary | ICD-10-CM | POA: Diagnosis not present

## 2021-08-15 DIAGNOSIS — I10 Essential (primary) hypertension: Secondary | ICD-10-CM | POA: Diagnosis not present

## 2021-08-15 DIAGNOSIS — I700 Atherosclerosis of aorta: Secondary | ICD-10-CM | POA: Diagnosis not present

## 2021-08-20 DIAGNOSIS — D225 Melanocytic nevi of trunk: Secondary | ICD-10-CM | POA: Diagnosis not present

## 2021-08-20 DIAGNOSIS — L814 Other melanin hyperpigmentation: Secondary | ICD-10-CM | POA: Diagnosis not present

## 2021-08-20 DIAGNOSIS — D485 Neoplasm of uncertain behavior of skin: Secondary | ICD-10-CM | POA: Diagnosis not present

## 2021-08-20 DIAGNOSIS — Z08 Encounter for follow-up examination after completed treatment for malignant neoplasm: Secondary | ICD-10-CM | POA: Diagnosis not present

## 2021-08-20 DIAGNOSIS — L570 Actinic keratosis: Secondary | ICD-10-CM | POA: Diagnosis not present

## 2021-08-20 DIAGNOSIS — L821 Other seborrheic keratosis: Secondary | ICD-10-CM | POA: Diagnosis not present

## 2021-08-20 DIAGNOSIS — D044 Carcinoma in situ of skin of scalp and neck: Secondary | ICD-10-CM | POA: Diagnosis not present

## 2021-08-20 DIAGNOSIS — Z85828 Personal history of other malignant neoplasm of skin: Secondary | ICD-10-CM | POA: Diagnosis not present

## 2021-08-20 DIAGNOSIS — C4442 Squamous cell carcinoma of skin of scalp and neck: Secondary | ICD-10-CM | POA: Diagnosis not present

## 2021-08-21 DIAGNOSIS — H04123 Dry eye syndrome of bilateral lacrimal glands: Secondary | ICD-10-CM | POA: Diagnosis not present

## 2021-08-21 DIAGNOSIS — Z947 Corneal transplant status: Secondary | ICD-10-CM | POA: Diagnosis not present

## 2021-08-21 DIAGNOSIS — Z48810 Encounter for surgical aftercare following surgery on the sense organs: Secondary | ICD-10-CM | POA: Diagnosis not present

## 2021-08-21 DIAGNOSIS — Z961 Presence of intraocular lens: Secondary | ICD-10-CM | POA: Diagnosis not present

## 2021-08-28 DIAGNOSIS — Z48810 Encounter for surgical aftercare following surgery on the sense organs: Secondary | ICD-10-CM | POA: Diagnosis not present

## 2021-08-28 DIAGNOSIS — H59011 Keratopathy (bullous aphakic) following cataract surgery, right eye: Secondary | ICD-10-CM | POA: Diagnosis not present

## 2021-08-28 DIAGNOSIS — H52201 Unspecified astigmatism, right eye: Secondary | ICD-10-CM | POA: Diagnosis not present

## 2021-08-28 DIAGNOSIS — H04123 Dry eye syndrome of bilateral lacrimal glands: Secondary | ICD-10-CM | POA: Diagnosis not present

## 2021-08-28 DIAGNOSIS — H1811 Bullous keratopathy, right eye: Secondary | ICD-10-CM | POA: Diagnosis not present

## 2021-09-05 DIAGNOSIS — M1711 Unilateral primary osteoarthritis, right knee: Secondary | ICD-10-CM | POA: Diagnosis not present

## 2021-09-05 DIAGNOSIS — M25561 Pain in right knee: Secondary | ICD-10-CM | POA: Diagnosis not present

## 2021-09-13 DIAGNOSIS — Z48810 Encounter for surgical aftercare following surgery on the sense organs: Secondary | ICD-10-CM | POA: Diagnosis not present

## 2021-09-13 DIAGNOSIS — H02401 Unspecified ptosis of right eyelid: Secondary | ICD-10-CM | POA: Diagnosis not present

## 2021-09-13 DIAGNOSIS — H59011 Keratopathy (bullous aphakic) following cataract surgery, right eye: Secondary | ICD-10-CM | POA: Diagnosis not present

## 2021-09-13 DIAGNOSIS — H04123 Dry eye syndrome of bilateral lacrimal glands: Secondary | ICD-10-CM | POA: Diagnosis not present

## 2021-09-14 DIAGNOSIS — R3914 Feeling of incomplete bladder emptying: Secondary | ICD-10-CM | POA: Diagnosis not present

## 2021-09-14 DIAGNOSIS — Z87440 Personal history of urinary (tract) infections: Secondary | ICD-10-CM | POA: Diagnosis not present

## 2021-09-25 DIAGNOSIS — R1030 Lower abdominal pain, unspecified: Secondary | ICD-10-CM | POA: Diagnosis not present

## 2021-09-25 DIAGNOSIS — R8290 Unspecified abnormal findings in urine: Secondary | ICD-10-CM | POA: Diagnosis not present

## 2021-09-28 DIAGNOSIS — H59011 Keratopathy (bullous aphakic) following cataract surgery, right eye: Secondary | ICD-10-CM | POA: Diagnosis not present

## 2021-09-28 DIAGNOSIS — H04123 Dry eye syndrome of bilateral lacrimal glands: Secondary | ICD-10-CM | POA: Diagnosis not present

## 2021-09-28 DIAGNOSIS — Z48810 Encounter for surgical aftercare following surgery on the sense organs: Secondary | ICD-10-CM | POA: Diagnosis not present

## 2021-09-28 DIAGNOSIS — Z947 Corneal transplant status: Secondary | ICD-10-CM | POA: Diagnosis not present

## 2021-10-01 DIAGNOSIS — I700 Atherosclerosis of aorta: Secondary | ICD-10-CM | POA: Diagnosis not present

## 2021-10-01 DIAGNOSIS — Z79899 Other long term (current) drug therapy: Secondary | ICD-10-CM | POA: Diagnosis not present

## 2021-10-01 DIAGNOSIS — Z1389 Encounter for screening for other disorder: Secondary | ICD-10-CM | POA: Diagnosis not present

## 2021-10-01 DIAGNOSIS — K219 Gastro-esophageal reflux disease without esophagitis: Secondary | ICD-10-CM | POA: Diagnosis not present

## 2021-10-01 DIAGNOSIS — E222 Syndrome of inappropriate secretion of antidiuretic hormone: Secondary | ICD-10-CM | POA: Diagnosis not present

## 2021-10-01 DIAGNOSIS — I10 Essential (primary) hypertension: Secondary | ICD-10-CM | POA: Diagnosis not present

## 2021-10-01 DIAGNOSIS — R011 Cardiac murmur, unspecified: Secondary | ICD-10-CM | POA: Diagnosis not present

## 2021-10-01 DIAGNOSIS — Z0000 Encounter for general adult medical examination without abnormal findings: Secondary | ICD-10-CM | POA: Diagnosis not present

## 2021-10-01 DIAGNOSIS — E7800 Pure hypercholesterolemia, unspecified: Secondary | ICD-10-CM | POA: Diagnosis not present

## 2021-10-10 DIAGNOSIS — R300 Dysuria: Secondary | ICD-10-CM | POA: Diagnosis not present

## 2021-10-12 DIAGNOSIS — R011 Cardiac murmur, unspecified: Secondary | ICD-10-CM | POA: Diagnosis not present

## 2021-10-25 DIAGNOSIS — H0288A Meibomian gland dysfunction right eye, upper and lower eyelids: Secondary | ICD-10-CM | POA: Diagnosis not present

## 2021-10-25 DIAGNOSIS — H0288B Meibomian gland dysfunction left eye, upper and lower eyelids: Secondary | ICD-10-CM | POA: Diagnosis not present

## 2021-10-25 DIAGNOSIS — H59011 Keratopathy (bullous aphakic) following cataract surgery, right eye: Secondary | ICD-10-CM | POA: Diagnosis not present

## 2021-10-25 DIAGNOSIS — H02402 Unspecified ptosis of left eyelid: Secondary | ICD-10-CM | POA: Diagnosis not present

## 2021-10-25 DIAGNOSIS — Z947 Corneal transplant status: Secondary | ICD-10-CM | POA: Diagnosis not present

## 2021-10-25 DIAGNOSIS — Z48810 Encounter for surgical aftercare following surgery on the sense organs: Secondary | ICD-10-CM | POA: Diagnosis not present

## 2021-10-25 DIAGNOSIS — H04123 Dry eye syndrome of bilateral lacrimal glands: Secondary | ICD-10-CM | POA: Diagnosis not present

## 2021-10-25 DIAGNOSIS — H02401 Unspecified ptosis of right eyelid: Secondary | ICD-10-CM | POA: Diagnosis not present

## 2021-11-09 DIAGNOSIS — E7800 Pure hypercholesterolemia, unspecified: Secondary | ICD-10-CM | POA: Diagnosis not present

## 2021-11-09 DIAGNOSIS — I10 Essential (primary) hypertension: Secondary | ICD-10-CM | POA: Diagnosis not present

## 2021-11-09 DIAGNOSIS — K219 Gastro-esophageal reflux disease without esophagitis: Secondary | ICD-10-CM | POA: Diagnosis not present

## 2021-11-09 DIAGNOSIS — I700 Atherosclerosis of aorta: Secondary | ICD-10-CM | POA: Diagnosis not present

## 2021-11-09 DIAGNOSIS — I480 Paroxysmal atrial fibrillation: Secondary | ICD-10-CM | POA: Diagnosis not present

## 2021-11-12 DIAGNOSIS — Z08 Encounter for follow-up examination after completed treatment for malignant neoplasm: Secondary | ICD-10-CM | POA: Diagnosis not present

## 2021-11-12 DIAGNOSIS — L820 Inflamed seborrheic keratosis: Secondary | ICD-10-CM | POA: Diagnosis not present

## 2021-11-12 DIAGNOSIS — Z86007 Personal history of in-situ neoplasm of skin: Secondary | ICD-10-CM | POA: Diagnosis not present

## 2021-11-12 DIAGNOSIS — L570 Actinic keratosis: Secondary | ICD-10-CM | POA: Diagnosis not present

## 2021-11-12 DIAGNOSIS — L821 Other seborrheic keratosis: Secondary | ICD-10-CM | POA: Diagnosis not present

## 2021-11-12 DIAGNOSIS — L718 Other rosacea: Secondary | ICD-10-CM | POA: Diagnosis not present

## 2021-11-12 DIAGNOSIS — L538 Other specified erythematous conditions: Secondary | ICD-10-CM | POA: Diagnosis not present

## 2021-11-14 DIAGNOSIS — I10 Essential (primary) hypertension: Secondary | ICD-10-CM | POA: Diagnosis not present

## 2021-11-14 DIAGNOSIS — E222 Syndrome of inappropriate secretion of antidiuretic hormone: Secondary | ICD-10-CM | POA: Diagnosis not present

## 2021-12-20 DIAGNOSIS — M1711 Unilateral primary osteoarthritis, right knee: Secondary | ICD-10-CM | POA: Diagnosis not present

## 2021-12-21 DIAGNOSIS — I10 Essential (primary) hypertension: Secondary | ICD-10-CM | POA: Diagnosis not present

## 2021-12-28 DIAGNOSIS — R300 Dysuria: Secondary | ICD-10-CM | POA: Diagnosis not present

## 2022-01-01 DIAGNOSIS — R300 Dysuria: Secondary | ICD-10-CM | POA: Diagnosis not present

## 2022-01-11 DIAGNOSIS — I10 Essential (primary) hypertension: Secondary | ICD-10-CM | POA: Diagnosis not present

## 2022-01-11 DIAGNOSIS — R300 Dysuria: Secondary | ICD-10-CM | POA: Diagnosis not present

## 2022-01-25 DIAGNOSIS — K219 Gastro-esophageal reflux disease without esophagitis: Secondary | ICD-10-CM | POA: Diagnosis not present

## 2022-01-25 DIAGNOSIS — E7800 Pure hypercholesterolemia, unspecified: Secondary | ICD-10-CM | POA: Diagnosis not present

## 2022-01-25 DIAGNOSIS — R300 Dysuria: Secondary | ICD-10-CM | POA: Diagnosis not present

## 2022-01-25 DIAGNOSIS — I10 Essential (primary) hypertension: Secondary | ICD-10-CM | POA: Diagnosis not present

## 2022-02-12 DIAGNOSIS — N390 Urinary tract infection, site not specified: Secondary | ICD-10-CM | POA: Diagnosis not present

## 2022-02-20 DIAGNOSIS — N3020 Other chronic cystitis without hematuria: Secondary | ICD-10-CM | POA: Diagnosis not present

## 2022-03-01 DIAGNOSIS — I10 Essential (primary) hypertension: Secondary | ICD-10-CM | POA: Diagnosis not present

## 2022-03-01 DIAGNOSIS — R260 Ataxic gait: Secondary | ICD-10-CM | POA: Diagnosis not present

## 2022-03-01 DIAGNOSIS — K5901 Slow transit constipation: Secondary | ICD-10-CM | POA: Diagnosis not present

## 2022-03-08 DIAGNOSIS — N3001 Acute cystitis with hematuria: Secondary | ICD-10-CM | POA: Diagnosis not present

## 2022-03-08 DIAGNOSIS — R300 Dysuria: Secondary | ICD-10-CM | POA: Diagnosis not present

## 2022-03-13 DIAGNOSIS — R3914 Feeling of incomplete bladder emptying: Secondary | ICD-10-CM | POA: Diagnosis not present

## 2022-03-13 DIAGNOSIS — N3020 Other chronic cystitis without hematuria: Secondary | ICD-10-CM | POA: Diagnosis not present

## 2022-03-14 DIAGNOSIS — H59011 Keratopathy (bullous aphakic) following cataract surgery, right eye: Secondary | ICD-10-CM | POA: Diagnosis not present

## 2022-03-14 DIAGNOSIS — H04123 Dry eye syndrome of bilateral lacrimal glands: Secondary | ICD-10-CM | POA: Diagnosis not present

## 2022-03-14 DIAGNOSIS — Z9889 Other specified postprocedural states: Secondary | ICD-10-CM | POA: Diagnosis not present

## 2022-03-14 DIAGNOSIS — Z947 Corneal transplant status: Secondary | ICD-10-CM | POA: Diagnosis not present

## 2022-03-14 DIAGNOSIS — Z961 Presence of intraocular lens: Secondary | ICD-10-CM | POA: Diagnosis not present

## 2022-03-17 ENCOUNTER — Emergency Department (HOSPITAL_BASED_OUTPATIENT_CLINIC_OR_DEPARTMENT_OTHER): Payer: Medicare Other

## 2022-03-17 ENCOUNTER — Emergency Department (HOSPITAL_BASED_OUTPATIENT_CLINIC_OR_DEPARTMENT_OTHER)
Admission: EM | Admit: 2022-03-17 | Discharge: 2022-03-17 | Disposition: A | Discharge status: Home / Self Care | Payer: Medicare Other | Attending: Emergency Medicine | Admitting: Emergency Medicine

## 2022-03-17 ENCOUNTER — Encounter (HOSPITAL_BASED_OUTPATIENT_CLINIC_OR_DEPARTMENT_OTHER): Payer: Self-pay

## 2022-03-17 DIAGNOSIS — Z79899 Other long term (current) drug therapy: Secondary | ICD-10-CM | POA: Insufficient documentation

## 2022-03-17 DIAGNOSIS — R531 Weakness: Secondary | ICD-10-CM | POA: Diagnosis not present

## 2022-03-17 DIAGNOSIS — E871 Hypo-osmolality and hyponatremia: Secondary | ICD-10-CM | POA: Diagnosis not present

## 2022-03-17 DIAGNOSIS — R918 Other nonspecific abnormal finding of lung field: Secondary | ICD-10-CM | POA: Diagnosis not present

## 2022-03-17 DIAGNOSIS — R911 Solitary pulmonary nodule: Secondary | ICD-10-CM

## 2022-03-17 DIAGNOSIS — R5383 Other fatigue: Secondary | ICD-10-CM | POA: Diagnosis not present

## 2022-03-17 DIAGNOSIS — Z20822 Contact with and (suspected) exposure to covid-19: Secondary | ICD-10-CM | POA: Diagnosis not present

## 2022-03-17 LAB — COMPREHENSIVE METABOLIC PANEL
ALT: 12 U/L (ref 0–44)
AST: 13 U/L — ABNORMAL LOW (ref 15–41)
Albumin: 3.6 g/dL (ref 3.5–5.0)
Alkaline Phosphatase: 65 U/L (ref 38–126)
Anion gap: 7 (ref 5–15)
BUN: 25 mg/dL — ABNORMAL HIGH (ref 8–23)
CO2: 29 mmol/L (ref 22–32)
Calcium: 8.8 mg/dL — ABNORMAL LOW (ref 8.9–10.3)
Chloride: 91 mmol/L — ABNORMAL LOW (ref 98–111)
Creatinine, Ser: 0.82 mg/dL (ref 0.44–1.00)
GFR, Estimated: >60 mL/min (ref >60)
Glucose, Bld: 107 mg/dL — ABNORMAL HIGH (ref 70–99)
Potassium: 3.5 mmol/L (ref 3.5–5.1)
Sodium: 127 mmol/L — ABNORMAL LOW (ref 135–145)
Total Bilirubin: 0.6 mg/dL (ref 0.3–1.2)
Total Protein: 6.4 g/dL — ABNORMAL LOW (ref 6.5–8.1)

## 2022-03-17 LAB — URINALYSIS, ROUTINE W REFLEX MICROSCOPIC
Bilirubin Urine: NEGATIVE
Glucose, UA: NEGATIVE mg/dL
Ketones, ur: NEGATIVE mg/dL
Leukocytes,Ua: NEGATIVE
Nitrite: NEGATIVE
Protein, ur: 30 mg/dL — AB
Specific Gravity, Urine: 1.005 (ref 1.005–1.030)
pH: 6.5 (ref 5.0–8.0)

## 2022-03-17 LAB — LIPASE, BLOOD: Lipase: <10 U/L — ABNORMAL LOW (ref 11–51)

## 2022-03-17 LAB — CBC WITH DIFFERENTIAL/PLATELET
Abs Immature Granulocytes: 0.03 10*3/uL (ref 0.00–0.07)
Basophils Absolute: 0 10*3/uL (ref 0.0–0.1)
Basophils Relative: 0 %
Eosinophils Absolute: 0.3 10*3/uL (ref 0.0–0.5)
Eosinophils Relative: 7 %
HCT: 35.1 % — ABNORMAL LOW (ref 36.0–46.0)
Hemoglobin: 12 g/dL (ref 12.0–15.0)
Immature Granulocytes: 1 %
Lymphocytes Relative: 11 %
Lymphs Abs: 0.6 10*3/uL — ABNORMAL LOW (ref 0.7–4.0)
MCH: 30.6 pg (ref 26.0–34.0)
MCHC: 34.2 g/dL (ref 30.0–36.0)
MCV: 89.5 fL (ref 80.0–100.0)
Monocytes Absolute: 0.5 10*3/uL (ref 0.1–1.0)
Monocytes Relative: 10 %
Neutro Abs: 3.6 10*3/uL (ref 1.7–7.7)
Neutrophils Relative %: 71 %
Platelets: 156 10*3/uL (ref 150–400)
RBC: 3.92 MIL/uL (ref 3.87–5.11)
RDW: 13 % (ref 11.5–15.5)
WBC: 5.1 10*3/uL (ref 4.0–10.5)
nRBC: 0 % (ref 0.0–0.2)

## 2022-03-17 LAB — SARS CORONAVIRUS 2 BY RT PCR: SARS Coronavirus 2 by RT PCR: NEGATIVE

## 2022-03-17 MED ORDER — SODIUM CHLORIDE 0.9 % IV BOLUS
500.0000 mL | Freq: Once | INTRAVENOUS | Status: AC
  Administered 2022-03-17: 500 mL via INTRAVENOUS

## 2022-03-17 MED ORDER — SODIUM CHLORIDE 0.9 % IV SOLN: INTRAVENOUS | Status: DC

## 2022-03-17 NOTE — Discharge Instructions (Addendum)
Chest x-ray had a tiny right base pulmonary nodule they usually recommend follow-up for that through the primary care doctor.  Sodium a little low today have that rechecked in about a week.  Otherwise work-up without any acute findings.  No signs of pneumonia.  No signs of urinary tract infection.

## 2022-03-17 NOTE — ED Triage Notes (Signed)
She c/o fatigue and some weakness. She denies fever/n/v/d, nor any other sign of current illness. She cites recent "uti" for which she was treated by Dr. Jeffie Pollock.

## 2022-03-17 NOTE — ED Provider Notes (Signed)
[facility] EMERGENCY DEPT Provider Note   CSN: [id number] Arrival date & time: 03/17/22  0840     History  Chief Complaint  Patient presents with   Fatigue    Michelle Mosley is a 88 y.o. female.  Patient brought in with concerns for recurrent urinary tract infections.  Patient's had fatigue and some weakness.  But no nausea vomiting or diarrhea.  There was a fall a few days ago with her not worried about any injuries related to that.  She had seen Dr. Idamae Schuller from urology also seen Dr. Felipa Eth her primary care internal medicine doctor that is retiring.  Patient last seen by [place] in September 2020 for symptomatic anemia.  Past medical history significant for hypertension they have been adjusting her meds and having her trend her blood pressures.  She did not take her blood pressure medicines this morning some blood pressures been elevated here today 186/93 but no strokelike symptoms.  And patient's had a history of deep vein thrombosis and arthritis.  Patient without any shortness of breath.  Oxygen levels are 99% on room air.  In addition patient's been having some signs of dementia.       Home Medications Prior to Admission medications   Medication Sig Start Date End Date Taking? Authorizing Provider  carvedilol (COREG) 6.25 MG tablet Take 6.25 mg by mouth 2 (two) times daily with a meal.    [provider]  Cholecalciferol (VITAMIN D) 2000 units CAPS Take by mouth daily.    [provider]  diltiazem (CARDIZEM) 120 MG tablet TAKE AS DIRECTED FOR 30 DAYS 07/12/19   [provider]  metroNIDAZOLE (METROCREAM) 0.75 % cream as needed.     [provider]  moxifloxacin (VIGAMOX) 0.5 % ophthalmic solution moxifloxacin 0.5 % eye drops    [provider]  polyethylene glycol (MIRALAX / GLYCOLAX) 17 g packet Take 17 g by mouth 2 (two) times daily. Patient taking differently: Take 17 g by mouth daily.  04/02/19   Danae Orleans, PA-C   sodium chloride (CVS SODIUM CHLORIDE) 5 % ophthalmic solution Place 1 drop into the right eye every 4 (four) hours as needed for eye irritation.    [provider]  valsartan-hydrochlorothiazide (DIOVAN-HCT) 320-12.5 MG per tablet Take 1 tablet by mouth daily.    [provider]      Allergies    Patient has no known allergies.    Review of Systems   Review of Systems  Constitutional:  Positive for fatigue. Negative for chills and fever.  HENT:  Positive for hearing loss. Negative for ear pain and sore throat.   Eyes:  Negative for pain and visual disturbance.  Respiratory:  Negative for cough and shortness of breath.   Cardiovascular:  Negative for chest pain and palpitations.  Gastrointestinal:  Negative for abdominal pain and vomiting.  Genitourinary:  Negative for dysuria and hematuria.  Musculoskeletal:  Negative for arthralgias and back pain.  Skin:  Negative for color change and rash.  Neurological:  Negative for seizures and syncope.  All other systems reviewed and are negative.   Physical Exam Updated Vital Signs BP (!) 186/93 (BP Location: Right Arm)   Pulse 65   Temp 98.1 F (36.7 C) (Oral)   Resp 18   SpO2 99%  Physical Exam Vitals and nursing note reviewed.  Constitutional:      General: She is not in acute distress.    Appearance: Normal appearance. She is well-developed.  HENT:  Head: Normocephalic and atraumatic.  Eyes:     Extraocular Movements: Extraocular movements intact.     Conjunctiva/sclera: Conjunctivae normal.     Pupils: Pupils are equal, round, and reactive to light.  Cardiovascular:     Rate and Rhythm: Normal rate and regular rhythm.     Heart sounds: No murmur heard. Pulmonary:     Effort: Pulmonary effort is normal. No respiratory distress.     Breath sounds: Normal breath sounds.  Abdominal:     Palpations: Abdomen is soft.     Tenderness: There is no abdominal tenderness.  Musculoskeletal:        General:  Swelling present.     Cervical back: Neck supple.     Right lower leg: Edema present.     Left lower leg: Edema present.     Comments: Mild edema to both lower extremities right greater than left but that is chronic in nature.  According to son.  Skin:    General: Skin is warm and dry.     Capillary Refill: Capillary refill takes less than 2 seconds.  Neurological:     General: No focal deficit present.     Mental Status: She is alert and oriented to person, place, and time.     Cranial Nerves: No cranial nerve deficit.     Sensory: No sensory deficit.     Motor: No weakness.  Psychiatric:        Mood and Affect: Mood normal.     ED Results / Procedures / Treatments   Labs (all labs ordered are listed, but only abnormal results are displayed) Labs Reviewed - No data to display  EKG None  Radiology No results found.  Procedures Procedures    Medications Ordered in ED Medications - No data to display  ED Course/ Medical Decision Making/ A&P                           Medical Decision Making Amount and/or Complexity of Data Reviewed Labs: ordered. Radiology: ordered.  Risk Prescription drug management.   Work-up for the fatigue without any acute findings.  Urinalysis negative for urinary tract infection.  Complete metabolic panel normal liver function test normal renal function.  Sodium at 127 CO2 is 29 glucose 107 no leukocytosis hemoglobin at 12.  COVID-negative portable chest x-ray raise concerns about a tiny nodular opacity in right lung base.  That can be followed up with outpatient CT and primary care follow-up.  For the hyponatremia patient's received normal saline here throughout her stay.  There was some delays due to getting the urinalysis.  I think this will be adequate for her to go home and they can followed up with a primary care doctor.  Otherwise no acute findings. Final Clinical Impression(s) / ED Diagnoses Final diagnoses:  None    Rx / DC  Orders ED Discharge Orders     None         Fredia Sorrow, MD 03/17/22 1504

## 2022-03-17 NOTE — ED Notes (Signed)
Attempts to obtain urine sample unsuccessful

## 2022-03-21 DIAGNOSIS — R269 Unspecified abnormalities of gait and mobility: Secondary | ICD-10-CM | POA: Diagnosis not present

## 2022-03-21 DIAGNOSIS — E871 Hypo-osmolality and hyponatremia: Secondary | ICD-10-CM | POA: Diagnosis not present

## 2022-03-21 DIAGNOSIS — R911 Solitary pulmonary nodule: Secondary | ICD-10-CM | POA: Diagnosis not present

## 2022-03-22 DIAGNOSIS — M1711 Unilateral primary osteoarthritis, right knee: Secondary | ICD-10-CM | POA: Diagnosis not present

## 2022-03-25 DIAGNOSIS — D485 Neoplasm of uncertain behavior of skin: Secondary | ICD-10-CM | POA: Diagnosis not present

## 2022-03-25 DIAGNOSIS — B079 Viral wart, unspecified: Secondary | ICD-10-CM | POA: Diagnosis not present

## 2022-03-25 DIAGNOSIS — L820 Inflamed seborrheic keratosis: Secondary | ICD-10-CM | POA: Diagnosis not present

## 2022-03-25 DIAGNOSIS — L570 Actinic keratosis: Secondary | ICD-10-CM | POA: Diagnosis not present

## 2022-03-25 DIAGNOSIS — L538 Other specified erythematous conditions: Secondary | ICD-10-CM | POA: Diagnosis not present

## 2022-03-31 DIAGNOSIS — Z86718 Personal history of other venous thrombosis and embolism: Secondary | ICD-10-CM | POA: Diagnosis not present

## 2022-03-31 DIAGNOSIS — E7800 Pure hypercholesterolemia, unspecified: Secondary | ICD-10-CM | POA: Diagnosis not present

## 2022-03-31 DIAGNOSIS — I872 Venous insufficiency (chronic) (peripheral): Secondary | ICD-10-CM | POA: Diagnosis not present

## 2022-03-31 DIAGNOSIS — I10 Essential (primary) hypertension: Secondary | ICD-10-CM | POA: Diagnosis not present

## 2022-03-31 DIAGNOSIS — K219 Gastro-esophageal reflux disease without esophagitis: Secondary | ICD-10-CM | POA: Diagnosis not present

## 2022-03-31 DIAGNOSIS — K5901 Slow transit constipation: Secondary | ICD-10-CM | POA: Diagnosis not present

## 2022-03-31 DIAGNOSIS — M170 Bilateral primary osteoarthritis of knee: Secondary | ICD-10-CM | POA: Diagnosis not present

## 2022-03-31 DIAGNOSIS — R269 Unspecified abnormalities of gait and mobility: Secondary | ICD-10-CM | POA: Diagnosis not present

## 2022-03-31 DIAGNOSIS — Z87440 Personal history of urinary (tract) infections: Secondary | ICD-10-CM | POA: Diagnosis not present

## 2022-03-31 DIAGNOSIS — E871 Hypo-osmolality and hyponatremia: Secondary | ICD-10-CM | POA: Diagnosis not present

## 2022-03-31 DIAGNOSIS — I4891 Unspecified atrial fibrillation: Secondary | ICD-10-CM | POA: Diagnosis not present

## 2022-03-31 DIAGNOSIS — R911 Solitary pulmonary nodule: Secondary | ICD-10-CM | POA: Diagnosis not present

## 2022-03-31 DIAGNOSIS — Z9181 History of falling: Secondary | ICD-10-CM | POA: Diagnosis not present

## 2022-03-31 DIAGNOSIS — I700 Atherosclerosis of aorta: Secondary | ICD-10-CM | POA: Diagnosis not present

## 2022-04-05 DIAGNOSIS — K219 Gastro-esophageal reflux disease without esophagitis: Secondary | ICD-10-CM | POA: Diagnosis not present

## 2022-04-05 DIAGNOSIS — I10 Essential (primary) hypertension: Secondary | ICD-10-CM | POA: Diagnosis not present

## 2022-04-05 DIAGNOSIS — Z86718 Personal history of other venous thrombosis and embolism: Secondary | ICD-10-CM | POA: Diagnosis not present

## 2022-04-05 DIAGNOSIS — K5901 Slow transit constipation: Secondary | ICD-10-CM | POA: Diagnosis not present

## 2022-04-05 DIAGNOSIS — R911 Solitary pulmonary nodule: Secondary | ICD-10-CM | POA: Diagnosis not present

## 2022-04-05 DIAGNOSIS — I872 Venous insufficiency (chronic) (peripheral): Secondary | ICD-10-CM | POA: Diagnosis not present

## 2022-04-05 DIAGNOSIS — E871 Hypo-osmolality and hyponatremia: Secondary | ICD-10-CM | POA: Diagnosis not present

## 2022-04-05 DIAGNOSIS — Z87440 Personal history of urinary (tract) infections: Secondary | ICD-10-CM | POA: Diagnosis not present

## 2022-04-05 DIAGNOSIS — I4891 Unspecified atrial fibrillation: Secondary | ICD-10-CM | POA: Diagnosis not present

## 2022-04-05 DIAGNOSIS — M170 Bilateral primary osteoarthritis of knee: Secondary | ICD-10-CM | POA: Diagnosis not present

## 2022-04-05 DIAGNOSIS — Z9181 History of falling: Secondary | ICD-10-CM | POA: Diagnosis not present

## 2022-04-05 DIAGNOSIS — R269 Unspecified abnormalities of gait and mobility: Secondary | ICD-10-CM | POA: Diagnosis not present

## 2022-04-05 DIAGNOSIS — I700 Atherosclerosis of aorta: Secondary | ICD-10-CM | POA: Diagnosis not present

## 2022-04-05 DIAGNOSIS — E7800 Pure hypercholesterolemia, unspecified: Secondary | ICD-10-CM | POA: Diagnosis not present

## 2022-04-09 DIAGNOSIS — E7800 Pure hypercholesterolemia, unspecified: Secondary | ICD-10-CM | POA: Diagnosis not present

## 2022-04-09 DIAGNOSIS — I4891 Unspecified atrial fibrillation: Secondary | ICD-10-CM | POA: Diagnosis not present

## 2022-04-09 DIAGNOSIS — Z87440 Personal history of urinary (tract) infections: Secondary | ICD-10-CM | POA: Diagnosis not present

## 2022-04-09 DIAGNOSIS — R269 Unspecified abnormalities of gait and mobility: Secondary | ICD-10-CM | POA: Diagnosis not present

## 2022-04-09 DIAGNOSIS — K219 Gastro-esophageal reflux disease without esophagitis: Secondary | ICD-10-CM | POA: Diagnosis not present

## 2022-04-09 DIAGNOSIS — I872 Venous insufficiency (chronic) (peripheral): Secondary | ICD-10-CM | POA: Diagnosis not present

## 2022-04-09 DIAGNOSIS — R911 Solitary pulmonary nodule: Secondary | ICD-10-CM | POA: Diagnosis not present

## 2022-04-09 DIAGNOSIS — K5901 Slow transit constipation: Secondary | ICD-10-CM | POA: Diagnosis not present

## 2022-04-09 DIAGNOSIS — Z86718 Personal history of other venous thrombosis and embolism: Secondary | ICD-10-CM | POA: Diagnosis not present

## 2022-04-09 DIAGNOSIS — M170 Bilateral primary osteoarthritis of knee: Secondary | ICD-10-CM | POA: Diagnosis not present

## 2022-04-09 DIAGNOSIS — E871 Hypo-osmolality and hyponatremia: Secondary | ICD-10-CM | POA: Diagnosis not present

## 2022-04-09 DIAGNOSIS — I700 Atherosclerosis of aorta: Secondary | ICD-10-CM | POA: Diagnosis not present

## 2022-04-09 DIAGNOSIS — Z9181 History of falling: Secondary | ICD-10-CM | POA: Diagnosis not present

## 2022-04-09 DIAGNOSIS — I10 Essential (primary) hypertension: Secondary | ICD-10-CM | POA: Diagnosis not present

## 2022-04-12 DIAGNOSIS — R269 Unspecified abnormalities of gait and mobility: Secondary | ICD-10-CM | POA: Diagnosis not present

## 2022-04-12 DIAGNOSIS — Z9181 History of falling: Secondary | ICD-10-CM | POA: Diagnosis not present

## 2022-04-12 DIAGNOSIS — I700 Atherosclerosis of aorta: Secondary | ICD-10-CM | POA: Diagnosis not present

## 2022-04-12 DIAGNOSIS — I10 Essential (primary) hypertension: Secondary | ICD-10-CM | POA: Diagnosis not present

## 2022-04-12 DIAGNOSIS — K5901 Slow transit constipation: Secondary | ICD-10-CM | POA: Diagnosis not present

## 2022-04-12 DIAGNOSIS — E7800 Pure hypercholesterolemia, unspecified: Secondary | ICD-10-CM | POA: Diagnosis not present

## 2022-04-12 DIAGNOSIS — Z86718 Personal history of other venous thrombosis and embolism: Secondary | ICD-10-CM | POA: Diagnosis not present

## 2022-04-12 DIAGNOSIS — I872 Venous insufficiency (chronic) (peripheral): Secondary | ICD-10-CM | POA: Diagnosis not present

## 2022-04-12 DIAGNOSIS — I4891 Unspecified atrial fibrillation: Secondary | ICD-10-CM | POA: Diagnosis not present

## 2022-04-12 DIAGNOSIS — E871 Hypo-osmolality and hyponatremia: Secondary | ICD-10-CM | POA: Diagnosis not present

## 2022-04-12 DIAGNOSIS — K219 Gastro-esophageal reflux disease without esophagitis: Secondary | ICD-10-CM | POA: Diagnosis not present

## 2022-04-12 DIAGNOSIS — Z87440 Personal history of urinary (tract) infections: Secondary | ICD-10-CM | POA: Diagnosis not present

## 2022-04-12 DIAGNOSIS — M170 Bilateral primary osteoarthritis of knee: Secondary | ICD-10-CM | POA: Diagnosis not present

## 2022-04-12 DIAGNOSIS — R911 Solitary pulmonary nodule: Secondary | ICD-10-CM | POA: Diagnosis not present

## 2022-04-15 DIAGNOSIS — R262 Difficulty in walking, not elsewhere classified: Secondary | ICD-10-CM | POA: Diagnosis not present

## 2022-04-15 DIAGNOSIS — R000 Tachycardia, unspecified: Secondary | ICD-10-CM | POA: Diagnosis not present

## 2022-04-16 ENCOUNTER — Emergency Department (HOSPITAL_BASED_OUTPATIENT_CLINIC_OR_DEPARTMENT_OTHER): Payer: Medicare Other

## 2022-04-16 ENCOUNTER — Encounter (HOSPITAL_BASED_OUTPATIENT_CLINIC_OR_DEPARTMENT_OTHER): Payer: Self-pay

## 2022-04-16 ENCOUNTER — Other Ambulatory Visit: Payer: Self-pay

## 2022-04-16 ENCOUNTER — Other Ambulatory Visit (HOSPITAL_BASED_OUTPATIENT_CLINIC_OR_DEPARTMENT_OTHER): Payer: Self-pay

## 2022-04-16 ENCOUNTER — Emergency Department (HOSPITAL_BASED_OUTPATIENT_CLINIC_OR_DEPARTMENT_OTHER)
Admission: EM | Admit: 2022-04-16 | Discharge: 2022-04-16 | Disposition: A | Discharge status: Home / Self Care | Payer: Medicare Other | Attending: Emergency Medicine | Admitting: Emergency Medicine

## 2022-04-16 DIAGNOSIS — R000 Tachycardia, unspecified: Secondary | ICD-10-CM | POA: Insufficient documentation

## 2022-04-16 DIAGNOSIS — I728 Aneurysm of other specified arteries: Secondary | ICD-10-CM | POA: Diagnosis not present

## 2022-04-16 DIAGNOSIS — N3289 Other specified disorders of bladder: Secondary | ICD-10-CM | POA: Diagnosis not present

## 2022-04-16 DIAGNOSIS — R600 Localized edema: Secondary | ICD-10-CM | POA: Insufficient documentation

## 2022-04-16 DIAGNOSIS — M6281 Muscle weakness (generalized): Secondary | ICD-10-CM | POA: Insufficient documentation

## 2022-04-16 DIAGNOSIS — N390 Urinary tract infection, site not specified: Secondary | ICD-10-CM | POA: Insufficient documentation

## 2022-04-16 DIAGNOSIS — N281 Cyst of kidney, acquired: Secondary | ICD-10-CM | POA: Diagnosis not present

## 2022-04-16 DIAGNOSIS — Z20822 Contact with and (suspected) exposure to covid-19: Secondary | ICD-10-CM | POA: Insufficient documentation

## 2022-04-16 DIAGNOSIS — R918 Other nonspecific abnormal finding of lung field: Secondary | ICD-10-CM | POA: Insufficient documentation

## 2022-04-16 DIAGNOSIS — R531 Weakness: Secondary | ICD-10-CM

## 2022-04-16 DIAGNOSIS — R7989 Other specified abnormal findings of blood chemistry: Secondary | ICD-10-CM | POA: Diagnosis not present

## 2022-04-16 LAB — CBC WITH DIFFERENTIAL/PLATELET
Abs Immature Granulocytes: 0.02 10*3/uL (ref 0.00–0.07)
Basophils Absolute: 0 10*3/uL (ref 0.0–0.1)
Basophils Relative: 0 %
Eosinophils Absolute: 0.1 10*3/uL (ref 0.0–0.5)
Eosinophils Relative: 2 %
HCT: 32 % — ABNORMAL LOW (ref 36.0–46.0)
Hemoglobin: 10.6 g/dL — ABNORMAL LOW (ref 12.0–15.0)
Immature Granulocytes: 0 %
Lymphocytes Relative: 23 %
Lymphs Abs: 1.2 10*3/uL (ref 0.7–4.0)
MCH: 30.9 pg (ref 26.0–34.0)
MCHC: 33.1 g/dL (ref 30.0–36.0)
MCV: 93.3 fL (ref 80.0–100.0)
Monocytes Absolute: 0.5 10*3/uL (ref 0.1–1.0)
Monocytes Relative: 8 %
Neutro Abs: 3.5 10*3/uL (ref 1.7–7.7)
Neutrophils Relative %: 67 %
Platelets: 164 10*3/uL (ref 150–400)
RBC: 3.43 MIL/uL — ABNORMAL LOW (ref 3.87–5.11)
RDW: 13.7 % (ref 11.5–15.5)
WBC: 5.4 10*3/uL (ref 4.0–10.5)
nRBC: 0 % (ref 0.0–0.2)

## 2022-04-16 LAB — RESP PANEL BY RT-PCR (FLU A&B, COVID) ARPGX2
Influenza A by PCR: NEGATIVE
Influenza B by PCR: NEGATIVE
SARS Coronavirus 2 by RT PCR: NEGATIVE

## 2022-04-16 LAB — URINALYSIS, ROUTINE W REFLEX MICROSCOPIC
Bilirubin Urine: NEGATIVE
Glucose, UA: NEGATIVE mg/dL
Hgb urine dipstick: NEGATIVE
Ketones, ur: NEGATIVE mg/dL
Nitrite: NEGATIVE
Protein, ur: NEGATIVE mg/dL
Specific Gravity, Urine: 1.005 (ref 1.005–1.030)
WBC, UA: >50 WBC/hpf — ABNORMAL HIGH (ref 0–5)
pH: 6 (ref 5.0–8.0)

## 2022-04-16 LAB — PROTIME-INR
INR: 1 (ref 0.8–1.2)
Prothrombin Time: 13.2 seconds (ref 11.4–15.2)

## 2022-04-16 LAB — COMPREHENSIVE METABOLIC PANEL
ALT: 14 U/L (ref 0–44)
AST: 17 U/L (ref 15–41)
Albumin: 3.6 g/dL (ref 3.5–5.0)
Alkaline Phosphatase: 78 U/L (ref 38–126)
Anion gap: 10 (ref 5–15)
BUN: 19 mg/dL (ref 8–23)
CO2: 26 mmol/L (ref 22–32)
Calcium: 8.5 mg/dL — ABNORMAL LOW (ref 8.9–10.3)
Chloride: 93 mmol/L — ABNORMAL LOW (ref 98–111)
Creatinine, Ser: 0.72 mg/dL (ref 0.44–1.00)
GFR, Estimated: >60 mL/min (ref >60)
Glucose, Bld: 93 mg/dL (ref 70–99)
Potassium: 4.3 mmol/L (ref 3.5–5.1)
Sodium: 129 mmol/L — ABNORMAL LOW (ref 135–145)
Total Bilirubin: 0.5 mg/dL (ref 0.3–1.2)
Total Protein: 6.1 g/dL — ABNORMAL LOW (ref 6.5–8.1)

## 2022-04-16 LAB — LACTIC ACID, PLASMA: Lactic Acid, Venous: 0.8 mmol/L (ref 0.5–1.9)

## 2022-04-16 LAB — TROPONIN I (HIGH SENSITIVITY): Troponin I (High Sensitivity): 12 ng/L (ref <18)

## 2022-04-16 LAB — LIPASE, BLOOD: Lipase: <10 U/L — ABNORMAL LOW (ref 11–51)

## 2022-04-16 MED ORDER — SODIUM CHLORIDE 0.9 % IV SOLN
1.0000 g | Freq: Once | INTRAVENOUS | Status: AC
  Administered 2022-04-16: 1 g via INTRAVENOUS
  Filled 2022-04-16: qty 10

## 2022-04-16 MED ORDER — CEPHALEXIN 500 MG PO CAPS
500.0000 mg | ORAL_CAPSULE | Freq: Four times a day (QID) | ORAL | 0 refills | Status: DC
  Filled 2022-04-16: qty 28, 7d supply, fill #0

## 2022-04-16 MED ORDER — IOHEXOL 350 MG/ML SOLN
75.0000 mL | Freq: Once | INTRAVENOUS | Status: AC | PRN
Start: 2022-04-16 — End: 2022-04-16
  Administered 2022-04-16: 75 mL via INTRAVENOUS

## 2022-04-16 MED ORDER — SODIUM CHLORIDE 0.9 % IV BOLUS
500.0000 mL | Freq: Once | INTRAVENOUS | Status: AC
  Administered 2022-04-16: 500 mL via INTRAVENOUS

## 2022-04-16 NOTE — ED Triage Notes (Signed)
Pt sent from [facility]- Dr Rajou (sp?). Pt states she had an elevated d-dimer (3.18). Pt was seen at [facility] due to weakness.  Pt was sent over for CT.

## 2022-04-16 NOTE — ED Provider Notes (Signed)
[facility] EMERGENCY DEPT Provider Note   CSN: [id number] Arrival date & time: 04/16/22  1191     History  Chief Complaint  Patient presents with   Weakness    Michelle Mosley is a 88 y.o. female.  She has frequent urinary tract infections and last was on antibiotics about a month ago.  She saw her doctor recently for malaise, was found to be tachycardic and had blood work done.  Patient states D-dimer was elevated and doctor sent her here for further evaluation.  She does not have headache sore throat chest pain shortness of breath vomiting diarrhea.  She does endorse a little suprapubic discomfort and dysuria chronic for her.  She has chronic leg swelling and leg pain from vascular issues.  She said she does not eat very much at baseline.  The history is provided by the patient.  Weakness Severity:  Moderate Onset quality:  Gradual Timing:  Constant Progression:  Unchanged Chronicity:  New Context: urinary tract infection   Relieved by:  Nothing Worsened by:  Activity Ineffective treatments:  Rest Associated symptoms: abdominal pain and dysuria   Associated symptoms: no chest pain, no cough, no diarrhea, no falls, no fever, no nausea, no shortness of breath, no stroke symptoms, no vision change and no vomiting        Home Medications Prior to Admission medications   Medication Sig Start Date End Date Taking? Authorizing Provider  carvedilol (COREG) 6.25 MG tablet Take 6.25 mg by mouth 2 (two) times daily with a meal.    [provider]  Cholecalciferol (VITAMIN D) 2000 units CAPS Take by mouth daily.    [provider]  diltiazem (CARDIZEM) 120 MG tablet TAKE AS DIRECTED FOR 30 DAYS 07/12/19   [provider]  metroNIDAZOLE (METROCREAM) 0.75 % cream as needed.     [provider]  moxifloxacin (VIGAMOX) 0.5 % ophthalmic solution moxifloxacin 0.5 % eye drops    [provider]  polyethylene glycol (MIRALAX /  GLYCOLAX) 17 g packet Take 17 g by mouth 2 (two) times daily. Patient taking differently: Take 17 g by mouth daily.  04/02/19   Danae Orleans, PA-C  sodium chloride ([employer] SODIUM CHLORIDE) 5 % ophthalmic solution Place 1 drop into the right eye every 4 (four) hours as needed for eye irritation.    [provider]  valsartan-hydrochlorothiazide (DIOVAN-HCT) 320-12.5 MG per tablet Take 1 tablet by mouth daily.    [provider]      Allergies    Patient has no known allergies.    Review of Systems   Review of Systems  Constitutional:  Negative for fever.  HENT:  Negative for sore throat.   Eyes:  Negative for visual disturbance.  Respiratory:  Negative for cough and shortness of breath.   Cardiovascular:  Negative for chest pain.  Gastrointestinal:  Positive for abdominal pain. Negative for diarrhea, nausea and vomiting.  Genitourinary:  Positive for dysuria.  Musculoskeletal:  Negative for falls.  Skin:  Negative for rash.  Neurological:  Positive for weakness.    Physical Exam Updated Vital Signs BP 100/70 (BP Location: Left Arm)   Pulse (!) 122   Temp 98.3 F (36.8 C) (Oral)   Resp 20   Ht '5\' 7"'$  (1.702 m)   Wt 68 kg   SpO2 100%   BMI 23.49 kg/m  Physical Exam Vitals and nursing note reviewed.  Constitutional:      General: She is not in acute distress.  Appearance: Normal appearance. She is well-developed.  HENT:     Head: Normocephalic and atraumatic.  Eyes:     Conjunctiva/sclera: Conjunctivae normal.  Cardiovascular:     Rate and Rhythm: Regular rhythm. Tachycardia present.     Heart sounds: No murmur heard. Pulmonary:     Effort: Pulmonary effort is normal. No respiratory distress.     Breath sounds: Normal breath sounds.  Abdominal:     Palpations: Abdomen is soft.     Tenderness: There is no abdominal tenderness. There is no guarding or rebound.  Musculoskeletal:        General: No tenderness.     Cervical back: Neck supple.     Right  lower leg: Edema present.     Left lower leg: Edema present.  Skin:    General: Skin is warm and dry.     Capillary Refill: Capillary refill takes less than 2 seconds.  Neurological:     General: No focal deficit present.     Mental Status: She is alert.     ED Results / Procedures / Treatments   Labs (all labs ordered are listed, but only abnormal results are displayed) Labs Reviewed  COMPREHENSIVE METABOLIC PANEL - Abnormal; Notable for the following components:      Result Value   Sodium 129 (*)    Chloride 93 (*)    Calcium 8.5 (*)    Total Protein 6.1 (*)    All other components within normal limits  LIPASE, BLOOD - Abnormal; Notable for the following components:   Lipase <10 (*)    All other components within normal limits  CBC WITH DIFFERENTIAL/PLATELET - Abnormal; Notable for the following components:   RBC 3.43 (*)    Hemoglobin 10.6 (*)    HCT 32.0 (*)    All other components within normal limits  URINALYSIS, ROUTINE W REFLEX MICROSCOPIC - Abnormal; Notable for the following components:   Leukocytes,Ua LARGE (*)    WBC, UA >50 (*)    All other components within normal limits  RESP PANEL BY RT-PCR (FLU A&B, COVID) ARPGX2  CULTURE, BLOOD (ROUTINE X 2)  CULTURE, BLOOD (ROUTINE X 2)  URINE CULTURE  LACTIC ACID, PLASMA  PROTIME-INR  LACTIC ACID, PLASMA  TROPONIN I (HIGH SENSITIVITY)  TROPONIN I (HIGH SENSITIVITY)    EKG EKG Interpretation  Date/Time:  Tuesday April 16 2022 10:24:05 EDT Ventricular Rate:  121 PR Interval:  186 QRS Duration: 74 QT Interval:  302 QTC Calculation: 428 R Axis:   20 Text Interpretation: Sinus tachycardia Otherwise normal ECG When compared with ECG of 03-Aug-2019 15:47, Vent. rate has increased BY  47 BPM Confirmed by Aletta Edouard [phone]) on 04/16/2022 10:32:03 AM  Radiology CT Abdomen Pelvis W Contrast  Addendum Date: 04/16/2022   ADDENDUM REPORT: 04/16/2022 13:21 ADDENDUM: No significant lymph node enlargement in the  abdomen or pelvis. Electronically Signed   By: Markus Daft M.D.   On: 04/16/2022 13:21   Result Date: 04/16/2022 CLINICAL DATA:  Abdominal pain, acute, nonlocalized. Elevated D-dimer. EXAM: CT ABDOMEN AND PELVIS WITH CONTRAST TECHNIQUE: Multidetector CT imaging of the abdomen and pelvis was performed using the standard protocol following bolus administration of intravenous contrast. RADIATION DOSE REDUCTION: This exam was performed according to the departmental dose-optimization program which includes automated exposure control, adjustment of the mA and/or kV according to patient size and/or use of iterative reconstruction technique. CONTRAST:  25m OMNIPAQUE IOHEXOL 350 MG/ML SOLN COMPARISON:  CT abdomen pelvis 05/12/2020 FINDINGS: Lower chest: No  acute abnormality. Hepatobiliary: Heterogeneous appearance of the liver on the initial set of images due to the phase of contrast. Liver has a more normal appearance on the delayed images but the entire liver is not imaged on the delayed images. Main portal veins are patent. Normal appearance of gallbladder without distention or inflammatory changes. Pancreas: Unremarkable. No pancreatic ductal dilatation or surrounding inflammatory changes. Spleen: Normal in size without focal abnormality. Adrenals/Urinary Tract: Right adrenal gland is normal. Left adrenal gland is poorly characterized. Evidence for bilateral renal cysts that do not require dedicated follow-up. No suspicious renal lesions. No hydronephrosis. Irregularity and mild wall thickening in the urinary bladder is minimally changed from a CT on 05/12/2020. Stomach/Bowel: Normal appearance of the stomach. No evidence for bowel dilatation or focal bowel inflammation. Vascular/Lymphatic: Again noted is a circumferentially calcified structure near the splenic hilum that measures up to 1.5 cm. Structure is unchanged from a chest CTA 04/03/2019 and most compatible with the stable splenic artery aneurysm that appears to  be at least partially thrombosed. Reproductive: Uterus and bilateral adnexa are unremarkable. Other: Negative for free fluid.  Negative for free air. Musculoskeletal: Multilevel disc space narrowing in the lumbar spine. Bilateral facet arthropathy in the lumbar spine. No acute bone abnormality. IMPRESSION: 1. No acute abnormality in the abdomen or pelvis. 2. Stable 1.5 cm splenic artery aneurysm. 3. Stable mild wall thickening in the urinary bladder. Findings are nonspecific. Electronically Signed: By: Markus Daft M.D. On: 04/16/2022 13:02   CT Angio Chest PE W/Cm &/Or Wo Cm  Result Date: 04/16/2022 CLINICAL DATA:  Positive D-dimer. Complains of weakness. History of DVT. EXAM: CT ANGIOGRAPHY CHEST WITH CONTRAST TECHNIQUE: Multidetector CT imaging of the chest was performed using the standard protocol during bolus administration of intravenous contrast. Multiplanar CT image reconstructions and MIPs were obtained to evaluate the vascular anatomy. RADIATION DOSE REDUCTION: This exam was performed according to the departmental dose-optimization program which includes automated exposure control, adjustment of the mA and/or kV according to patient size and/or use of iterative reconstruction technique. CONTRAST:  36m OMNIPAQUE IOHEXOL 350 MG/ML SOLN COMPARISON:  Chest CT angiogram April 03, 2019 FINDINGS: Cardiovascular: Satisfactory opacification of the pulmonary arteries to the segmental level. No evidence of pulmonary embolism. Normal heart size. No pericardial effusion. Mediastinum/Nodes: No enlarged mediastinal, hilar, or axillary lymph nodes. Thyroid gland, trachea, and esophagus demonstrate no significant findings. Lungs/Pleura: Clustered nodules in the peripheral right upper lobe (series 5, image 143) measuring up to 3 mm, favored to be infectious or inflammatory. No pleural effusion or pneumothorax. Upper Abdomen: Timing of the contrast bolus limits assessment of the visualized upper abdomen. Within this  limitation, there is reflux of contrast into the hepatic veins, which can be seen in the setting of right heart dysfunction. Musculoskeletal: No chest wall abnormality. No acute or significant osseous findings. Review of the MIP images confirms the above findings. IMPRESSION: 1. No evidence of pulmonary embolism. 2. Clustered nodules in the peripheral right upper lobe measuring up to 3 mm, favored to be infectious or inflammatory. 3. Reflux of contrast into the hepatic veins can be seen in the setting of right heart dysfunction; correlate with echocardiography, as clinically indicated. Electronically Signed   By: HMarin RobertsM.D.   On: 04/16/2022 12:51    Procedures Procedures    Medications Ordered in ED Medications  sodium chloride 0.9 % bolus 500 mL (0 mLs Intravenous Stopped 04/16/22 1111)  iohexol (OMNIPAQUE) 350 MG/ML injection 75 mL (75 mLs Intravenous Contrast Given  04/16/22 1223)  cefTRIAXone (ROCEPHIN) 1 g in sodium chloride 0.9 % 100 mL IVPB (0 g Intravenous Stopped 04/16/22 1312)    ED Course/ Medical Decision Making/ A&P Clinical Course as of 04/16/22 1811  Tue Apr 16, 2022  1357 Patient remains tachycardic although otherwise well-appearing.  Reviewed results of work-up with her.  Will cover with antibiotics for possible urine infection.  Will need close follow-up with PCP. [MB]    Clinical Course User Index [MB] Hayden Rasmussen, MD                           Medical Decision Making Amount and/or Complexity of Data Reviewed Labs: ordered. Radiology: ordered.  Risk Prescription drug management.   This patient complains of suprapubic discomfort and dysuria, elevated D-dimer, malaise; this involves an extensive number of treatment Options and is a complaint that carries with it a high risk of complications and morbidity. The differential includes PE, infection, inflammation, UTI, metabolic derangement, dehydration  I ordered, reviewed and interpreted labs, which included  CBC with normal white count, hemoglobin low slightly worse than priors, chemistries with low sodium normal renal function normal LFTs, lactate normal, COVID and flu negative, troponin negative, urinalysis with possible signs of infection greater than 50 white blood cells I ordered medication IV fluids IV antibiotics and reviewed PMP when indicated. I ordered imaging studies which included CT angio chest, CT abdomen and pelvis and I independently    visualized and interpreted imaging which showed no acute PE.  Does have some inflammatory versus infection changes in the lung.  CT abdomen and pelvis does not show any acute findings, does have some bladder thickening Additional history obtained from patient's friend Previous records obtained and reviewed in epic including recent PCP visits Cardiac monitoring reviewed, sinus tachycardia Social determinants considered, no significant barriers Critical Interventions: None  After the interventions stated above, I reevaluated the patient and found patient to be well-appearing although still tachycardic around 110. Admission and further testing considered, admission considered but no clear indication at this time.  She agrees to closely follow-up with PCP, encouraged to take a good amount of fluids and will put on antibiotics for possible UTI.  Return instructions discussed         Final Clinical Impression(s) / ED Diagnoses Final diagnoses:  General weakness  Sinus tachycardia  Lower urinary tract infection, acute    Rx / DC Orders ED Discharge Orders          Ordered    cephALEXin (KEFLEX) 500 MG capsule  4 times daily        04/16/22 1359              Hayden Rasmussen, MD 04/16/22 1814

## 2022-04-16 NOTE — Discharge Instructions (Signed)
You were seen in the emergency department for general weakness and elevated heart rate.  You had a CAT scan of your chest that did not show any evidence of blood clot.  Your urine did look like possible infection and so were given some fluids and antibiotics.  Please finish your oral antibiotics.  Keep well-hydrated.  Follow-up with your primary care doctor and urologist.  Return to the emergency department if any worsening or concerning symptoms.

## 2022-04-17 LAB — URINE CULTURE: Culture: <10000 — AB

## 2022-04-21 LAB — CULTURE, BLOOD (ROUTINE X 2)
Culture: NO GROWTH
Culture: NO GROWTH

## 2022-04-24 DIAGNOSIS — E7800 Pure hypercholesterolemia, unspecified: Secondary | ICD-10-CM | POA: Diagnosis not present

## 2022-04-24 DIAGNOSIS — R269 Unspecified abnormalities of gait and mobility: Secondary | ICD-10-CM | POA: Diagnosis not present

## 2022-04-24 DIAGNOSIS — I700 Atherosclerosis of aorta: Secondary | ICD-10-CM | POA: Diagnosis not present

## 2022-04-24 DIAGNOSIS — K5901 Slow transit constipation: Secondary | ICD-10-CM | POA: Diagnosis not present

## 2022-04-24 DIAGNOSIS — Z86718 Personal history of other venous thrombosis and embolism: Secondary | ICD-10-CM | POA: Diagnosis not present

## 2022-04-24 DIAGNOSIS — Z9181 History of falling: Secondary | ICD-10-CM | POA: Diagnosis not present

## 2022-04-24 DIAGNOSIS — I4891 Unspecified atrial fibrillation: Secondary | ICD-10-CM | POA: Diagnosis not present

## 2022-04-24 DIAGNOSIS — I10 Essential (primary) hypertension: Secondary | ICD-10-CM | POA: Diagnosis not present

## 2022-04-24 DIAGNOSIS — Z87440 Personal history of urinary (tract) infections: Secondary | ICD-10-CM | POA: Diagnosis not present

## 2022-04-24 DIAGNOSIS — E871 Hypo-osmolality and hyponatremia: Secondary | ICD-10-CM | POA: Diagnosis not present

## 2022-04-24 DIAGNOSIS — I872 Venous insufficiency (chronic) (peripheral): Secondary | ICD-10-CM | POA: Diagnosis not present

## 2022-04-24 DIAGNOSIS — M170 Bilateral primary osteoarthritis of knee: Secondary | ICD-10-CM | POA: Diagnosis not present

## 2022-04-24 DIAGNOSIS — K219 Gastro-esophageal reflux disease without esophagitis: Secondary | ICD-10-CM | POA: Diagnosis not present

## 2022-04-24 DIAGNOSIS — R911 Solitary pulmonary nodule: Secondary | ICD-10-CM | POA: Diagnosis not present

## 2022-04-26 DIAGNOSIS — K219 Gastro-esophageal reflux disease without esophagitis: Secondary | ICD-10-CM | POA: Diagnosis not present

## 2022-04-26 DIAGNOSIS — I872 Venous insufficiency (chronic) (peripheral): Secondary | ICD-10-CM | POA: Diagnosis not present

## 2022-04-26 DIAGNOSIS — I4891 Unspecified atrial fibrillation: Secondary | ICD-10-CM | POA: Diagnosis not present

## 2022-04-26 DIAGNOSIS — I10 Essential (primary) hypertension: Secondary | ICD-10-CM | POA: Diagnosis not present

## 2022-04-26 DIAGNOSIS — M170 Bilateral primary osteoarthritis of knee: Secondary | ICD-10-CM | POA: Diagnosis not present

## 2022-04-26 DIAGNOSIS — I700 Atherosclerosis of aorta: Secondary | ICD-10-CM | POA: Diagnosis not present

## 2022-04-26 DIAGNOSIS — Z9181 History of falling: Secondary | ICD-10-CM | POA: Diagnosis not present

## 2022-04-26 DIAGNOSIS — Z86718 Personal history of other venous thrombosis and embolism: Secondary | ICD-10-CM | POA: Diagnosis not present

## 2022-04-26 DIAGNOSIS — K5901 Slow transit constipation: Secondary | ICD-10-CM | POA: Diagnosis not present

## 2022-04-26 DIAGNOSIS — R911 Solitary pulmonary nodule: Secondary | ICD-10-CM | POA: Diagnosis not present

## 2022-04-26 DIAGNOSIS — R269 Unspecified abnormalities of gait and mobility: Secondary | ICD-10-CM | POA: Diagnosis not present

## 2022-04-26 DIAGNOSIS — E871 Hypo-osmolality and hyponatremia: Secondary | ICD-10-CM | POA: Diagnosis not present

## 2022-04-26 DIAGNOSIS — Z87440 Personal history of urinary (tract) infections: Secondary | ICD-10-CM | POA: Diagnosis not present

## 2022-04-26 DIAGNOSIS — E7800 Pure hypercholesterolemia, unspecified: Secondary | ICD-10-CM | POA: Diagnosis not present

## 2022-04-30 DIAGNOSIS — I10 Essential (primary) hypertension: Secondary | ICD-10-CM | POA: Diagnosis not present

## 2022-04-30 DIAGNOSIS — E7800 Pure hypercholesterolemia, unspecified: Secondary | ICD-10-CM | POA: Diagnosis not present

## 2022-04-30 DIAGNOSIS — K219 Gastro-esophageal reflux disease without esophagitis: Secondary | ICD-10-CM | POA: Diagnosis not present

## 2022-05-02 DIAGNOSIS — M170 Bilateral primary osteoarthritis of knee: Secondary | ICD-10-CM | POA: Diagnosis not present

## 2022-05-02 DIAGNOSIS — I700 Atherosclerosis of aorta: Secondary | ICD-10-CM | POA: Diagnosis not present

## 2022-05-02 DIAGNOSIS — R911 Solitary pulmonary nodule: Secondary | ICD-10-CM | POA: Diagnosis not present

## 2022-05-02 DIAGNOSIS — Z86718 Personal history of other venous thrombosis and embolism: Secondary | ICD-10-CM | POA: Diagnosis not present

## 2022-05-02 DIAGNOSIS — E871 Hypo-osmolality and hyponatremia: Secondary | ICD-10-CM | POA: Diagnosis not present

## 2022-05-02 DIAGNOSIS — I4891 Unspecified atrial fibrillation: Secondary | ICD-10-CM | POA: Diagnosis not present

## 2022-05-02 DIAGNOSIS — R269 Unspecified abnormalities of gait and mobility: Secondary | ICD-10-CM | POA: Diagnosis not present

## 2022-05-02 DIAGNOSIS — Z87440 Personal history of urinary (tract) infections: Secondary | ICD-10-CM | POA: Diagnosis not present

## 2022-05-02 DIAGNOSIS — K219 Gastro-esophageal reflux disease without esophagitis: Secondary | ICD-10-CM | POA: Diagnosis not present

## 2022-05-02 DIAGNOSIS — I872 Venous insufficiency (chronic) (peripheral): Secondary | ICD-10-CM | POA: Diagnosis not present

## 2022-05-02 DIAGNOSIS — E7800 Pure hypercholesterolemia, unspecified: Secondary | ICD-10-CM | POA: Diagnosis not present

## 2022-05-02 DIAGNOSIS — Z9181 History of falling: Secondary | ICD-10-CM | POA: Diagnosis not present

## 2022-05-02 DIAGNOSIS — I10 Essential (primary) hypertension: Secondary | ICD-10-CM | POA: Diagnosis not present

## 2022-05-02 DIAGNOSIS — K5901 Slow transit constipation: Secondary | ICD-10-CM | POA: Diagnosis not present

## 2022-05-03 DIAGNOSIS — I700 Atherosclerosis of aorta: Secondary | ICD-10-CM | POA: Diagnosis not present

## 2022-05-03 DIAGNOSIS — I872 Venous insufficiency (chronic) (peripheral): Secondary | ICD-10-CM | POA: Diagnosis not present

## 2022-05-03 DIAGNOSIS — R911 Solitary pulmonary nodule: Secondary | ICD-10-CM | POA: Diagnosis not present

## 2022-05-03 DIAGNOSIS — M170 Bilateral primary osteoarthritis of knee: Secondary | ICD-10-CM | POA: Diagnosis not present

## 2022-05-03 DIAGNOSIS — I4891 Unspecified atrial fibrillation: Secondary | ICD-10-CM | POA: Diagnosis not present

## 2022-05-03 DIAGNOSIS — R269 Unspecified abnormalities of gait and mobility: Secondary | ICD-10-CM | POA: Diagnosis not present

## 2022-05-03 DIAGNOSIS — I10 Essential (primary) hypertension: Secondary | ICD-10-CM | POA: Diagnosis not present

## 2022-05-03 DIAGNOSIS — E7800 Pure hypercholesterolemia, unspecified: Secondary | ICD-10-CM | POA: Diagnosis not present

## 2022-05-03 DIAGNOSIS — Z9181 History of falling: Secondary | ICD-10-CM | POA: Diagnosis not present

## 2022-05-03 DIAGNOSIS — K5901 Slow transit constipation: Secondary | ICD-10-CM | POA: Diagnosis not present

## 2022-05-03 DIAGNOSIS — K219 Gastro-esophageal reflux disease without esophagitis: Secondary | ICD-10-CM | POA: Diagnosis not present

## 2022-05-03 DIAGNOSIS — E871 Hypo-osmolality and hyponatremia: Secondary | ICD-10-CM | POA: Diagnosis not present

## 2022-05-03 DIAGNOSIS — Z86718 Personal history of other venous thrombosis and embolism: Secondary | ICD-10-CM | POA: Diagnosis not present

## 2022-05-03 DIAGNOSIS — Z87440 Personal history of urinary (tract) infections: Secondary | ICD-10-CM | POA: Diagnosis not present

## 2022-05-06 DIAGNOSIS — Z87440 Personal history of urinary (tract) infections: Secondary | ICD-10-CM | POA: Diagnosis not present

## 2022-05-06 DIAGNOSIS — I4891 Unspecified atrial fibrillation: Secondary | ICD-10-CM | POA: Diagnosis not present

## 2022-05-06 DIAGNOSIS — R269 Unspecified abnormalities of gait and mobility: Secondary | ICD-10-CM | POA: Diagnosis not present

## 2022-05-06 DIAGNOSIS — E871 Hypo-osmolality and hyponatremia: Secondary | ICD-10-CM | POA: Diagnosis not present

## 2022-05-06 DIAGNOSIS — I872 Venous insufficiency (chronic) (peripheral): Secondary | ICD-10-CM | POA: Diagnosis not present

## 2022-05-06 DIAGNOSIS — K5901 Slow transit constipation: Secondary | ICD-10-CM | POA: Diagnosis not present

## 2022-05-06 DIAGNOSIS — K219 Gastro-esophageal reflux disease without esophagitis: Secondary | ICD-10-CM | POA: Diagnosis not present

## 2022-05-06 DIAGNOSIS — I10 Essential (primary) hypertension: Secondary | ICD-10-CM | POA: Diagnosis not present

## 2022-05-06 DIAGNOSIS — Z9181 History of falling: Secondary | ICD-10-CM | POA: Diagnosis not present

## 2022-05-06 DIAGNOSIS — R911 Solitary pulmonary nodule: Secondary | ICD-10-CM | POA: Diagnosis not present

## 2022-05-06 DIAGNOSIS — E7800 Pure hypercholesterolemia, unspecified: Secondary | ICD-10-CM | POA: Diagnosis not present

## 2022-05-06 DIAGNOSIS — M170 Bilateral primary osteoarthritis of knee: Secondary | ICD-10-CM | POA: Diagnosis not present

## 2022-05-06 DIAGNOSIS — I700 Atherosclerosis of aorta: Secondary | ICD-10-CM | POA: Diagnosis not present

## 2022-05-06 DIAGNOSIS — Z86718 Personal history of other venous thrombosis and embolism: Secondary | ICD-10-CM | POA: Diagnosis not present

## 2022-05-08 DIAGNOSIS — R350 Frequency of micturition: Secondary | ICD-10-CM | POA: Diagnosis not present

## 2022-05-09 DIAGNOSIS — I4891 Unspecified atrial fibrillation: Secondary | ICD-10-CM | POA: Diagnosis not present

## 2022-05-09 DIAGNOSIS — K219 Gastro-esophageal reflux disease without esophagitis: Secondary | ICD-10-CM | POA: Diagnosis not present

## 2022-05-09 DIAGNOSIS — I872 Venous insufficiency (chronic) (peripheral): Secondary | ICD-10-CM | POA: Diagnosis not present

## 2022-05-09 DIAGNOSIS — E7800 Pure hypercholesterolemia, unspecified: Secondary | ICD-10-CM | POA: Diagnosis not present

## 2022-05-09 DIAGNOSIS — M170 Bilateral primary osteoarthritis of knee: Secondary | ICD-10-CM | POA: Diagnosis not present

## 2022-05-09 DIAGNOSIS — I700 Atherosclerosis of aorta: Secondary | ICD-10-CM | POA: Diagnosis not present

## 2022-05-09 DIAGNOSIS — Z9181 History of falling: Secondary | ICD-10-CM | POA: Diagnosis not present

## 2022-05-09 DIAGNOSIS — I10 Essential (primary) hypertension: Secondary | ICD-10-CM | POA: Diagnosis not present

## 2022-05-09 DIAGNOSIS — R911 Solitary pulmonary nodule: Secondary | ICD-10-CM | POA: Diagnosis not present

## 2022-05-09 DIAGNOSIS — Z87440 Personal history of urinary (tract) infections: Secondary | ICD-10-CM | POA: Diagnosis not present

## 2022-05-09 DIAGNOSIS — K5901 Slow transit constipation: Secondary | ICD-10-CM | POA: Diagnosis not present

## 2022-05-09 DIAGNOSIS — Z86718 Personal history of other venous thrombosis and embolism: Secondary | ICD-10-CM | POA: Diagnosis not present

## 2022-05-09 DIAGNOSIS — E871 Hypo-osmolality and hyponatremia: Secondary | ICD-10-CM | POA: Diagnosis not present

## 2022-05-09 DIAGNOSIS — R269 Unspecified abnormalities of gait and mobility: Secondary | ICD-10-CM | POA: Diagnosis not present

## 2022-05-13 DIAGNOSIS — R000 Tachycardia, unspecified: Secondary | ICD-10-CM | POA: Diagnosis not present

## 2022-05-14 DIAGNOSIS — K219 Gastro-esophageal reflux disease without esophagitis: Secondary | ICD-10-CM | POA: Diagnosis not present

## 2022-05-14 DIAGNOSIS — I4891 Unspecified atrial fibrillation: Secondary | ICD-10-CM | POA: Diagnosis not present

## 2022-05-14 DIAGNOSIS — Z87440 Personal history of urinary (tract) infections: Secondary | ICD-10-CM | POA: Diagnosis not present

## 2022-05-14 DIAGNOSIS — E871 Hypo-osmolality and hyponatremia: Secondary | ICD-10-CM | POA: Diagnosis not present

## 2022-05-14 DIAGNOSIS — R911 Solitary pulmonary nodule: Secondary | ICD-10-CM | POA: Diagnosis not present

## 2022-05-14 DIAGNOSIS — Z9181 History of falling: Secondary | ICD-10-CM | POA: Diagnosis not present

## 2022-05-14 DIAGNOSIS — M170 Bilateral primary osteoarthritis of knee: Secondary | ICD-10-CM | POA: Diagnosis not present

## 2022-05-14 DIAGNOSIS — Z86718 Personal history of other venous thrombosis and embolism: Secondary | ICD-10-CM | POA: Diagnosis not present

## 2022-05-14 DIAGNOSIS — E7800 Pure hypercholesterolemia, unspecified: Secondary | ICD-10-CM | POA: Diagnosis not present

## 2022-05-14 DIAGNOSIS — K5901 Slow transit constipation: Secondary | ICD-10-CM | POA: Diagnosis not present

## 2022-05-14 DIAGNOSIS — I700 Atherosclerosis of aorta: Secondary | ICD-10-CM | POA: Diagnosis not present

## 2022-05-14 DIAGNOSIS — I872 Venous insufficiency (chronic) (peripheral): Secondary | ICD-10-CM | POA: Diagnosis not present

## 2022-05-14 DIAGNOSIS — R269 Unspecified abnormalities of gait and mobility: Secondary | ICD-10-CM | POA: Diagnosis not present

## 2022-05-14 DIAGNOSIS — I10 Essential (primary) hypertension: Secondary | ICD-10-CM | POA: Diagnosis not present

## 2022-05-20 DIAGNOSIS — R300 Dysuria: Secondary | ICD-10-CM | POA: Diagnosis not present

## 2022-05-20 DIAGNOSIS — E871 Hypo-osmolality and hyponatremia: Secondary | ICD-10-CM | POA: Diagnosis not present

## 2022-05-20 DIAGNOSIS — R609 Edema, unspecified: Secondary | ICD-10-CM | POA: Diagnosis not present

## 2022-05-20 DIAGNOSIS — R000 Tachycardia, unspecified: Secondary | ICD-10-CM | POA: Diagnosis not present

## 2022-05-30 DIAGNOSIS — M25561 Pain in right knee: Secondary | ICD-10-CM | POA: Diagnosis not present

## 2022-05-30 DIAGNOSIS — M1711 Unilateral primary osteoarthritis, right knee: Secondary | ICD-10-CM | POA: Diagnosis not present

## 2022-06-05 ENCOUNTER — Other Ambulatory Visit: Payer: Self-pay

## 2022-06-05 ENCOUNTER — Encounter (HOSPITAL_BASED_OUTPATIENT_CLINIC_OR_DEPARTMENT_OTHER): Payer: Self-pay | Admitting: Emergency Medicine

## 2022-06-05 ENCOUNTER — Emergency Department (HOSPITAL_BASED_OUTPATIENT_CLINIC_OR_DEPARTMENT_OTHER)
Admission: EM | Admit: 2022-06-05 | Discharge: 2022-06-05 | Disposition: A | Discharge status: Home / Self Care | Payer: Medicare Other | Attending: Emergency Medicine | Admitting: Emergency Medicine

## 2022-06-05 ENCOUNTER — Emergency Department (HOSPITAL_BASED_OUTPATIENT_CLINIC_OR_DEPARTMENT_OTHER): Payer: Medicare Other | Admitting: Radiology

## 2022-06-05 DIAGNOSIS — R142 Eructation: Secondary | ICD-10-CM | POA: Diagnosis not present

## 2022-06-05 DIAGNOSIS — M7989 Other specified soft tissue disorders: Secondary | ICD-10-CM | POA: Diagnosis not present

## 2022-06-05 DIAGNOSIS — I4891 Unspecified atrial fibrillation: Secondary | ICD-10-CM | POA: Diagnosis not present

## 2022-06-05 DIAGNOSIS — R0609 Other forms of dyspnea: Secondary | ICD-10-CM | POA: Diagnosis not present

## 2022-06-05 DIAGNOSIS — R000 Tachycardia, unspecified: Secondary | ICD-10-CM | POA: Diagnosis not present

## 2022-06-05 DIAGNOSIS — I509 Heart failure, unspecified: Secondary | ICD-10-CM | POA: Diagnosis not present

## 2022-06-05 DIAGNOSIS — Z79899 Other long term (current) drug therapy: Secondary | ICD-10-CM | POA: Insufficient documentation

## 2022-06-05 DIAGNOSIS — I110 Hypertensive heart disease with heart failure: Secondary | ICD-10-CM | POA: Diagnosis not present

## 2022-06-05 DIAGNOSIS — R0602 Shortness of breath: Secondary | ICD-10-CM | POA: Diagnosis not present

## 2022-06-05 LAB — CBC
HCT: 39.8 % (ref 36.0–46.0)
Hemoglobin: 13 g/dL (ref 12.0–15.0)
MCH: 31.6 pg (ref 26.0–34.0)
MCHC: 32.7 g/dL (ref 30.0–36.0)
MCV: 96.6 fL (ref 80.0–100.0)
Platelets: 215 10*3/uL (ref 150–400)
RBC: 4.12 MIL/uL (ref 3.87–5.11)
RDW: 15.2 % (ref 11.5–15.5)
WBC: 7.6 10*3/uL (ref 4.0–10.5)
nRBC: 0 % (ref 0.0–0.2)

## 2022-06-05 LAB — COMPREHENSIVE METABOLIC PANEL
ALT: 47 U/L — ABNORMAL HIGH (ref 0–44)
AST: 17 U/L (ref 15–41)
Albumin: 4.2 g/dL (ref 3.5–5.0)
Alkaline Phosphatase: 123 U/L (ref 38–126)
Anion gap: 8 (ref 5–15)
BUN: 22 mg/dL (ref 8–23)
CO2: 30 mmol/L (ref 22–32)
Calcium: 9.1 mg/dL (ref 8.9–10.3)
Chloride: 95 mmol/L — ABNORMAL LOW (ref 98–111)
Creatinine, Ser: 0.92 mg/dL (ref 0.44–1.00)
GFR, Estimated: >60 mL/min (ref >60)
Glucose, Bld: 97 mg/dL (ref 70–99)
Potassium: 3.9 mmol/L (ref 3.5–5.1)
Sodium: 133 mmol/L — ABNORMAL LOW (ref 135–145)
Total Bilirubin: 1.1 mg/dL (ref 0.3–1.2)
Total Protein: 6.9 g/dL (ref 6.5–8.1)

## 2022-06-05 LAB — BRAIN NATRIURETIC PEPTIDE: B Natriuretic Peptide: 403.7 pg/mL — ABNORMAL HIGH (ref 0.0–100.0)

## 2022-06-05 LAB — TROPONIN I (HIGH SENSITIVITY): Troponin I (High Sensitivity): 14 ng/L (ref <18)

## 2022-06-05 MED ORDER — FUROSEMIDE 10 MG/ML IJ SOLN
20.0000 mg | Freq: Once | INTRAMUSCULAR | Status: AC
  Administered 2022-06-05: 20 mg via INTRAVENOUS
  Filled 2022-06-05: qty 2

## 2022-06-05 NOTE — ED Provider Notes (Signed)
[facility] EMERGENCY DEPT Provider Note   CSN: [id number] Arrival date & time: 06/05/22  1908     History  Chief Complaint  Patient presents with   Shortness of Breath    Michelle Mosley is a 88 y.o. female.  HPI 88 year old female presents at the request of her doctor.  She has been feeling exertional shortness of breath and generalized weakness for a while.  Maybe up to 2 months.  No significant chest pain though occasionally she will get some back pain.  She has chronic leg swelling.  She is on Lasix though went from taking it 3 times a week to daily in the last 1 week or so from her doctor.  She has lost some weight with this.  She went to her doctor today and they started her on Eliquis for on and off atrial fibs and atrial flutter.  Took her first dose today.  However her labs came back showing a BNP of around 500 and a troponin in the 20s and so she was told to go to the ER.  Home Medications Prior to Admission medications   Medication Sig Start Date End Date Taking? Authorizing Provider  busPIRone (BUSPAR) 5 MG tablet Take 5 mg by mouth 2 (two) times daily. 02/01/22   [provider]  CARTIA XT 120 MG 24 hr capsule Take 120 mg by mouth daily. 02/20/22   [provider]  carvedilol (COREG) 6.25 MG tablet Take 6.25 mg by mouth 2 (two) times daily with a meal.    [provider]  cephALEXin (KEFLEX) 500 MG capsule Take 1 capsule (500 mg total) by mouth 4 (four) times daily. 04/16/22   Hayden Rasmussen, MD  Cholecalciferol (VITAMIN D) 2000 units CAPS Take by mouth daily.    [provider]  diltiazem (CARDIZEM) 120 MG tablet TAKE AS DIRECTED FOR 30 DAYS 07/12/19   [provider]  furosemide (LASIX) 20 MG tablet Take 20 mg by mouth daily. 03/22/22   [provider]  metroNIDAZOLE (METROCREAM) 0.75 % cream as needed.     [provider]  moxifloxacin (VIGAMOX) 0.5 % ophthalmic solution moxifloxacin 0.5 % eye  drops    [provider]  polyethylene glycol (MIRALAX / GLYCOLAX) 17 g packet Take 17 g by mouth 2 (two) times daily. Patient taking differently: Take 17 g by mouth daily.  04/02/19   Danae Orleans, PA-C  prednisoLONE acetate (PRED FORTE) 1 % ophthalmic suspension SMARTSIG:In Eye(s) 03/15/22   [provider]  sodium chloride ([employer] SODIUM CHLORIDE) 5 % ophthalmic solution Place 1 drop into the right eye every 4 (four) hours as needed for eye irritation.    [provider]  TOLAK 4 % CREA Apply topically. 04/08/22   [provider]  valsartan (DIOVAN) 320 MG tablet Take 320 mg by mouth daily. 02/01/22   [provider]  valsartan-hydrochlorothiazide (DIOVAN-HCT) 320-12.5 MG per tablet Take 1 tablet by mouth daily.    [provider]      Allergies    Patient has no known allergies.    Review of Systems   Review of Systems  Constitutional:  Positive for fatigue.  Respiratory:  Positive for shortness of breath.   Cardiovascular:  Positive for leg swelling. Negative for chest pain.    Physical Exam Updated Vital Signs BP 123/88   Pulse 91   Temp 98.3 F (36.8 C)   Resp 16   Wt 70.4 kg   SpO2 96%  BMI 24.31 kg/m  Physical Exam Vitals and nursing note reviewed.  Constitutional:      General: She is not in acute distress.    Appearance: She is well-developed. She is not ill-appearing or diaphoretic.  HENT:     Head: Normocephalic and atraumatic.  Cardiovascular:     Rate and Rhythm: Normal rate. Rhythm irregular.     Heart sounds: Normal heart sounds.  Pulmonary:     Effort: Pulmonary effort is normal. No tachypnea, accessory muscle usage or respiratory distress.     Breath sounds: Normal breath sounds. No wheezing.  Abdominal:     Palpations: Abdomen is soft.     Tenderness: There is no abdominal tenderness.  Musculoskeletal:     Right lower leg: Edema present.     Left lower leg: Edema present.     Comments: Mild R>L  lower leg swelling  Skin:    General: Skin is warm and dry.  Neurological:     Mental Status: She is alert.     ED Results / Procedures / Treatments   Labs (all labs ordered are listed, but only abnormal results are displayed) Labs Reviewed  BRAIN NATRIURETIC PEPTIDE - Abnormal; Notable for the following components:      Result Value   B Natriuretic Peptide 403.7 (*)    All other components within normal limits  COMPREHENSIVE METABOLIC PANEL - Abnormal; Notable for the following components:   Sodium 133 (*)    Chloride 95 (*)    ALT 47 (*)    All other components within normal limits  CBC  TROPONIN I (HIGH SENSITIVITY)    EKG EKG Interpretation  Date/Time:  Wednesday June 05 2022 19:28:38 EST Ventricular Rate:  99 PR Interval:    QRS Duration: 74 QT Interval:  316 QTC Calculation: 405 R Axis:   21 Text Interpretation: Atrial flutter with variable A-V block Abnormal ECG Confirmed by Sherwood Gambler [phone]) on 06/05/2022 7:44:24 PM  Radiology DG Chest 2 View  Result Date: 06/05/2022 CLINICAL DATA:  Shortness of breath. Elevated BNP. Atrial fibrillation. EXAM: CHEST - 2 VIEW COMPARISON:  Chest CTA 04/16/2022, radiograph 03/17/2022 FINDINGS: Rotated exam. Mild cardiomegaly. Stable mediastinal contours with aortic atherosclerosis. No pulmonary edema. Mild biapical pleuroparenchymal scarring. No acute airspace disease. No pleural fluid or pneumothorax. No acute osseous findings. IMPRESSION: Mild cardiomegaly. No radiographic findings or congestive failure. Electronically Signed   By: Keith Rake M.D.   On: 06/05/2022 20:24    Procedures Procedures    Medications Ordered in ED Medications  furosemide (LASIX) injection 20 mg (20 mg Intravenous Given 06/05/22 2150)    ED Course/ Medical Decision Making/ A&P                           Medical Decision Making Amount and/or Complexity of Data Reviewed External Data Reviewed: notes. Labs: ordered.    Details:  Troponin 14.  BNP 400.  Otherwise electrolytes unremarkable. Radiology: ordered and independent interpretation performed.    Details: No apparent CHF on x-ray ECG/medicine tests: independent interpretation performed.    Details: Atrial flutter  Risk Prescription drug management.   Suspect patient has some mild CHF.  This is probably been going on for weeks if not longer.  May be related to her intermittent arrhythmia.  Currently seems to be in atrial flutter.  However she has not having rapid ventricular rate and otherwise appears well.  No significant JVD noted.  At this point, her  troponin is normal and I suspect the slightly elevated troponin from outpatient testing was related to some mild CHF and may be even atrial flutter rather than ACS.  Her presentation is not consistent with ACS.  Discussed options with patient and son and we have decided to do some IV Lasix now and increase her Lasix for the next couple days.  She does not want hospitalization which I think is reasonable.  Otherwise we will discharge home to follow-up with cardiology as an outpatient.  Given return precautions.        Final Clinical Impression(s) / ED Diagnoses Final diagnoses:  Acute congestive heart failure, unspecified heart failure type ([place])    Rx / DC Orders ED Discharge Orders          Ordered    Ambulatory referral to Cardiology       Comments: If you have not heard from the Cardiology office within the next 72 hours please call [phone].   06/05/22 1030              Sherwood Gambler, MD 06/05/22 2153

## 2022-06-05 NOTE — ED Triage Notes (Signed)
Patient seen by PCP. doe Labs drawn elevated BNP >500 trop 28 Afib 90s-120s Started on eliquis today Feeling anxious

## 2022-06-05 NOTE — Discharge Instructions (Addendum)
Increase your Lasix from 20 mg to 40 mg each morning.  Do this for 3 total days.  After that go back to your normal dose.  If you develop new or worsening shortness of breath, chest pain, heart palpitations, or any other new/concerning symptoms then return to the ER for evaluation or call 911.

## 2022-06-11 ENCOUNTER — Ambulatory Visit (HOSPITAL_COMMUNITY)
Admission: RE | Admit: 2022-06-11 | Discharge: 2022-06-11 | Disposition: A | Discharge status: Home / Self Care | Payer: Medicare Other | Source: Ambulatory Visit | Attending: Nurse Practitioner | Admitting: Nurse Practitioner

## 2022-06-11 ENCOUNTER — Encounter (HOSPITAL_COMMUNITY): Payer: Self-pay | Admitting: Nurse Practitioner

## 2022-06-11 VITALS — BP 140/92 | HR 79 | Ht 67.0 in | Wt 154.4 lb

## 2022-06-11 DIAGNOSIS — D6869 Other thrombophilia: Secondary | ICD-10-CM | POA: Diagnosis not present

## 2022-06-11 DIAGNOSIS — I480 Paroxysmal atrial fibrillation: Secondary | ICD-10-CM | POA: Diagnosis not present

## 2022-06-11 DIAGNOSIS — Z79899 Other long term (current) drug therapy: Secondary | ICD-10-CM | POA: Diagnosis not present

## 2022-06-11 DIAGNOSIS — Z86718 Personal history of other venous thrombosis and embolism: Secondary | ICD-10-CM | POA: Insufficient documentation

## 2022-06-11 DIAGNOSIS — I4892 Unspecified atrial flutter: Secondary | ICD-10-CM | POA: Insufficient documentation

## 2022-06-11 DIAGNOSIS — I110 Hypertensive heart disease with heart failure: Secondary | ICD-10-CM | POA: Insufficient documentation

## 2022-06-11 DIAGNOSIS — I509 Heart failure, unspecified: Secondary | ICD-10-CM | POA: Insufficient documentation

## 2022-06-11 DIAGNOSIS — Z7901 Long term (current) use of anticoagulants: Secondary | ICD-10-CM | POA: Insufficient documentation

## 2022-06-11 LAB — BASIC METABOLIC PANEL
Anion gap: 8 (ref 5–15)
BUN: 16 mg/dL (ref 8–23)
CO2: 30 mmol/L (ref 22–32)
Calcium: 8.8 mg/dL — ABNORMAL LOW (ref 8.9–10.3)
Chloride: 94 mmol/L — ABNORMAL LOW (ref 98–111)
Creatinine, Ser: 1.08 mg/dL — ABNORMAL HIGH (ref 0.44–1.00)
GFR, Estimated: 50 mL/min — ABNORMAL LOW (ref >60)
Glucose, Bld: 94 mg/dL (ref 70–99)
Potassium: 4.9 mmol/L (ref 3.5–5.1)
Sodium: 132 mmol/L — ABNORMAL LOW (ref 135–145)

## 2022-06-12 ENCOUNTER — Encounter (HOSPITAL_COMMUNITY): Payer: Self-pay | Admitting: Nurse Practitioner

## 2022-06-12 NOTE — Progress Notes (Signed)
Primary Care Physician: Lajean Manes, MD Referring Physician: ED f/u   Michelle Mosley is a 88 y.o. female with a h/o HTN, DVT rt leg, LEE, osteoarthritis of both knees, newly dx paroxsymal afib that is in the Afib clinic for f/u ED visit. She had seen her PCP for tachycardia and LEE. Labs drawn showed a BNP of 500 and a troponin of 20 so she was called at home and told to go to the ED. She was started on eliquis at PCP as EKG showed atrial flutter.she was already on daily lasix but it was increased in the ED for suspicion  of mild CHF. She  did not fit the picture of ACS.   She was asked to f/u in the Afib clinic. Today, she is in rate controlled atrial flutter. She states she lost  6-7 lbs with increase of lasix to 40 mg a day and her LEE is much improved. Her rt leg usually swells more that the left for arthritis of the knee. She does not describe any dyspnea. She is wearing a 2 week zio patch that was placed at the PCP office that will come off 12/6. She has a previously scheduled appointment pending with Dr. Beckie Salts 06/27/22. She is compliant with eliquis. She was already on carvedilol 25 mg bid and diltiazem 120 mg daily.   Today, she denies symptoms of palpitations, chest pain, shortness of breath, orthopnea, PND, lower extremity edema, dizziness, presyncope, syncope, or neurologic sequela. The patient is tolerating medications without difficulties and is otherwise without complaint today.   Past Medical History:  Diagnosis Date   Arthritis    DVT (deep venous thrombosis) ([place])    Hypertension    Past Surgical History:  Procedure Laterality Date   broken neck  [place]   excision of breast cyst Left    TOTAL KNEE ARTHROPLASTY Left 04/01/2019   Procedure: TOTAL KNEE ARTHROPLASTY;  Surgeon: [name], MD;  Location: [facility];  Service: Orthopedics;  Laterality: Left;  70 mins    Current Outpatient Medications  Medication Sig Dispense Refill    busPIRone (BUSPAR) 5 MG tablet Take 5 mg by mouth 2 (two) times daily.     CARTIA XT 120 MG 24 hr capsule Take 120 mg by mouth daily.     carvedilol (COREG) 25 MG tablet Take 25 mg by mouth 2 (two) times daily with a meal.     cephALEXin (KEFLEX) 500 MG capsule Take 1 capsule (500 mg total) by mouth 4 (four) times daily. 28 capsule 0   Cholecalciferol (VITAMIN D) 2000 units CAPS Take by mouth daily.     diltiazem (CARDIZEM) 120 MG tablet TAKE AS DIRECTED FOR 30 DAYS     ELIQUIS 5 MG TABS tablet Take 5 mg by mouth 2 (two) times daily.     furosemide (LASIX) 20 MG tablet Take 20 mg by mouth 2 (two) times daily.     metroNIDAZOLE (METROCREAM) 0.75 % cream as needed.      moxifloxacin (VIGAMOX) 0.5 % ophthalmic solution moxifloxacin 0.5 % eye drops     polyethylene glycol (MIRALAX / GLYCOLAX) 17 g packet Take 17 g by mouth 2 (two) times daily. (Patient taking differently: Take 17 g by mouth daily.) 28 packet 0   prednisoLONE acetate (PRED FORTE) 1 % ophthalmic suspension SMARTSIG:In Eye(s)     sodium chloride ([employer]S SODIUM CHLORIDE) 5 % ophthalmic solution Place 1 drop into the right eye every 4 (  four) hours as needed for eye irritation.     TOLAK 4 % CREA Apply topically.     valsartan (DIOVAN) 320 MG tablet Take 320 mg by mouth daily.     No current facility-administered medications for this encounter.    No Known Allergies  Social History   Socioeconomic History   Marital status: Widowed    Spouse name: Not on file   Number of children: Not on file   Years of education: Not on file   Highest education level: Not on file  Occupational History   Not on file  Tobacco Use   Smoking status: Never   Smokeless tobacco: Never   Tobacco comments:    Never smoke 06/11/22  Vaping Use   Vaping Use: Never used  Substance and Sexual Activity   Alcohol use: No   Drug use: Never   Sexual activity: Not on file  Other Topics Concern   Not on file  Social History Narrative   Not on file    Social Determinants of Health   Financial Resource Strain: Not on file  Food Insecurity: Not on file  Transportation Needs: Not on file  Physical Activity: Not on file  Stress: Not on file  Social Connections: Not on file  Intimate Partner Violence: Not on file    Family History  Problem Relation Age of Onset   Colon cancer Mother     ROS- All systems are reviewed and negative except as per the HPI above  Physical Exam: Vitals:   06/11/22 1534  BP: (!) 140/92  Pulse: 79  Weight: 70 kg  Height: '5\' 7"'$  (1.702 m)   Wt Readings from Last 3 Encounters:  06/11/22 70 kg  06/05/22 70.4 kg  04/16/22 68 kg    Labs: Lab Results  Component Value Date   NA 132 (L) 06/11/2022   K 4.9 06/11/2022   CL 94 (L) 06/11/2022   CO2 30 06/11/2022   GLUCOSE 94 06/11/2022   BUN 16 06/11/2022   CREATININE 1.08 (H) 06/11/2022   CALCIUM 8.8 (L) 06/11/2022   Lab Results  Component Value Date   INR 1.0 04/16/2022   No results found for: "CHOL", "HDL", "LDLCALC", "TRIG"   GEN- The patient is well appearing, alert and oriented x 3 today.   Head- normocephalic, atraumatic Eyes-  Sclera clear, conjunctiva pink Ears- hearing intact Oropharynx- clear Neck- supple, no JVP Lymph- no cervical lymphadenopathy Lungs- Clear to ausculation bilaterally, normal work of breathing Heart- Regular rate and rhythm, no murmurs, rubs or gallops, PMI not laterally displaced GI- soft, NT, ND, + BS Extremities- no clubbing, cyanosis, or edema MS- no significant deformity or atrophy Skin- no rash or lesion Psych- euthymic mood, full affect Neuro- strength and sensation are intact  EKG-Vent. rate 79 BPM PR interval 178 ms QRS duration 76 ms QT/QTcB 344/394 ms P-R-T axes 80 57 57 Atrial flutter similar to Jun 05 2022 Confirmed by Sherwood Gambler [phone]) on 06/12/2022 8:38:52 AM  Echo-  04/04/19-1. Left ventricular ejection fraction, by visual estimation, is 55 to  60%. The left ventricle has  normal function. Normal left ventricular size.  There is mildly increased left ventricular hypertrophy.   2. Left ventricular diastolic Doppler parameters are consistent with  impaired relaxation pattern of LV diastolic filling.   3. Global right ventricle has normal systolic function.The right  ventricular size is normal. No increase in right ventricular wall  thickness.   4. Left atrial size was normal.   5.  Right atrial size was normal.   6. Moderate aortic valve annular calcification.   7. The mitral valve is grossly normal. Trace mitral valve regurgitation.   8. The tricuspid valve is abnormal. Tricuspid valve regurgitation  moderate-severe.   9. The tricuspid valve is thickened and appears to incompletely coapt.  10. The aortic valve is tricuspid Aortic valve regurgitation was not  visualized by color flow Doppler.  11. The pulmonic valve was grossly normal. Pulmonic valve regurgitation is  trivial by color flow Doppler.  12. Normal pulmonary artery systolic pressure.  13. The tricuspid regurgitant velocity is 2.52 m/s, and with an assumed  right atrial pressure of 3 mmHg, the estimated right ventricular systolic  pressure is normal at 28.4 mmHg.  14. The inferior vena cava is normal in size with greater than 50%  respiratory variability, suggesting right atrial pressure of 3 mmHg.   Assessment and Plan:  1.Newly dx  Atrial flutter  Wearing a 2 week monitor, ends 12/6  Reviewed with pt when removed take that day to post office to be mailed back  In atrial flutter, rate controlled today Continue carvedilol 25 mg bid and diltiazem 120 mg daily   2, CHA2DS2VASc score of at least 6 Continue  eliquis 5 mg daily   3. Htn  Stable  4. CHF Feels better with lasix 40 mg daily  Improved with fluid loss of 6-7 lbs Continue lasix 40 mg daily and bmet   drawn today  is ok to continue this dos  F/u with Dr. Lovena Le 12/14 as scheduled   Geroge Baseman. Meshawn Oconnor, [facility] [address] [place], [place] [address] [phone]

## 2022-06-13 DIAGNOSIS — Z947 Corneal transplant status: Secondary | ICD-10-CM | POA: Diagnosis not present

## 2022-06-13 DIAGNOSIS — H59011 Keratopathy (bullous aphakic) following cataract surgery, right eye: Secondary | ICD-10-CM | POA: Diagnosis not present

## 2022-06-13 DIAGNOSIS — H02403 Unspecified ptosis of bilateral eyelids: Secondary | ICD-10-CM | POA: Diagnosis not present

## 2022-06-13 DIAGNOSIS — H04123 Dry eye syndrome of bilateral lacrimal glands: Secondary | ICD-10-CM | POA: Diagnosis not present

## 2022-06-27 ENCOUNTER — Encounter: Payer: Self-pay | Admitting: Internal Medicine

## 2022-06-27 ENCOUNTER — Ambulatory Visit: Payer: Medicare Other | Attending: Internal Medicine | Admitting: Internal Medicine

## 2022-06-27 VITALS — BP 110/68 | HR 91 | Ht 67.0 in | Wt 157.2 lb

## 2022-06-27 DIAGNOSIS — I491 Atrial premature depolarization: Secondary | ICD-10-CM

## 2022-06-27 DIAGNOSIS — R000 Tachycardia, unspecified: Secondary | ICD-10-CM | POA: Diagnosis not present

## 2022-06-27 NOTE — Progress Notes (Signed)
HPI Michelle Mosley is referred by Michelle Mosley for evaluation of atrial flutter. She is a pleasant 88 yo woman with a h/o HTN, osteo arthritis, and atrial flutter with a mostly controlled VR. She was seen in the afib clinic a month ago and is referred for additional evaluation. She has worn a 14 day zio monitor which demonstrated atrial flutter with a CVR and RVR. Her flutter is atypical.  No Known Allergies   Current Outpatient Medications  Medication Sig Dispense Refill   busPIRone (BUSPAR) 5 MG tablet Take 5 mg by mouth 2 (two) times daily.     CARTIA XT 120 MG 24 hr capsule Take 120 mg by mouth daily.     carvedilol (COREG) 25 MG tablet Take 25 mg by mouth 2 (two) times daily with a meal.     cephALEXin (KEFLEX) 500 MG capsule Take 1 capsule (500 mg total) by mouth 4 (four) times daily. 28 capsule 0   Cholecalciferol (VITAMIN D) 2000 units CAPS Take by mouth daily.     diltiazem (CARDIZEM) 120 MG tablet TAKE AS DIRECTED FOR 30 DAYS     ELIQUIS 5 MG TABS tablet Take 5 mg by mouth 2 (two) times daily.     furosemide (LASIX) 20 MG tablet Take 20 mg by mouth 2 (two) times daily.     metroNIDAZOLE (METROCREAM) 0.75 % cream as needed.      moxifloxacin (VIGAMOX) 0.5 % ophthalmic solution moxifloxacin 0.5 % eye drops     polyethylene glycol (MIRALAX / GLYCOLAX) 17 g packet Take 17 g by mouth 2 (two) times daily. (Patient taking differently: Take 17 g by mouth daily.) 28 packet 0   prednisoLONE acetate (PRED FORTE) 1 % ophthalmic suspension SMARTSIG:In Eye(s)     sodium chloride (CVS SODIUM CHLORIDE) 5 % ophthalmic solution Place 1 drop into the right eye every 4 (four) hours as needed for eye irritation.     TOLAK 4 % CREA Apply topically.     valsartan (DIOVAN) 320 MG tablet Take 320 mg by mouth daily.     No current facility-administered medications for this visit.     Past Medical History:  Diagnosis Date   Arthritis    DVT (deep venous thrombosis) ([facility])    Hypertension      ROS:   All systems reviewed and negative except as noted in the HPI.   Past Surgical History:  Procedure Laterality Date   broken neck  [place]   excision of breast cyst Left    TOTAL KNEE ARTHROPLASTY Left 04/01/2019   Procedure: TOTAL KNEE ARTHROPLASTY;  Surgeon: [name], MD;  Location: [facility];  Service: Orthopedics;  Laterality: Left;  70 mins     Family History  Problem Relation Age of Onset   Colon cancer Mother      Social History   Socioeconomic History   Marital status: Widowed    Spouse name: Not on file   Number of children: Not on file   Years of education: Not on file   Highest education level: Not on file  Occupational History   Not on file  Tobacco Use   Smoking status: Never   Smokeless tobacco: Never   Tobacco comments:    Never smoke 06/11/22  Vaping Use   Vaping Use: Never used  Substance and Sexual Activity   Alcohol use: No   Drug use: Never   Sexual activity: Not on file  Other Topics Concern   Not on file  Social History Narrative   Not on file   Social Determinants of Health   Financial Resource Strain: Not on file  Food Insecurity: Not on file  Transportation Needs: Not on file  Physical Activity: Not on file  Stress: Not on file  Social Connections: Not on file  Intimate Partner Violence: Not on file     BP 110/68   Pulse 91   Ht '5\' 7"'$  (1.702 m)   Wt 157 lb 3.2 oz (71.3 kg)   SpO2 93%   BMI 24.62 kg/m   Physical Exam:  Well appearing NAD HEENT: Unremarkable Neck:  No JVD, no thyromegally Lymphatics:  No adenopathy Back:  No CVA tenderness Lungs:  Clear with no wheezes HEART:  Regular rate rhythm, no murmurs, no rubs, no clicks Abd:  soft, positive bowel sounds, no organomegally, no rebound, no guarding Ext:  2 plus pulses, no edema, no cyanosis, no clubbing Skin:  No rashes no nodules Neuro:  CN II through XII intact, motor grossly intact  EKG - atypical atrial flutter with  a CVR   Assess/Plan: Atypical atrial flutter - appears to LA flutter due to the positive flutter waves in inferior leads and V1. I have recommended either continued rate control or a trial of dofetilide after 3 days in the hospital.  HTN - her bp is well controlled. No change in meds.  [name] [name],MD

## 2022-06-27 NOTE — Patient Instructions (Signed)
Medication Instructions:  NO CHANGES *If you need a refill on your cardiac medications before your next appointment, please call your pharmacy*   Lab Work: NONE If you have labs (blood work) drawn today and your tests are completely normal, you will receive your results only by: [place] (if you have MyChart) OR A paper copy in the mail If you have any lab test that is abnormal or we need to change your treatment, we will call you to review the results.   Testing/Procedures: NONE   Follow-Up: At [facility], you and your health needs are our priority.  As part of our continuing mission to provide you with exceptional heart care, we have created designated Provider Care Teams.  These Care Teams include your primary Cardiologist (physician) and Advanced Practice Providers (APPs -  Physician Assistants and Nurse Practitioners) who all work together to provide you with the care you need, when you need it.  We recommend signing up for the patient portal called "MyChart".  Sign up information is provided on this After Visit Summary.  MyChart is used to connect with patients for Virtual Visits (Telemedicine).  Patients are able to view lab/test results, encounter notes, upcoming appointments, etc.  Non-urgent messages can be sent to your provider as well.   To learn more about what you can do with MyChart, go to [email or web address].    Your next appointment:   PENDING  TREATMENT PLAN  The format for your next appointment:     Provider:     Other Instructions PT TO CALL BACK IF DECIDES TO PROCEED WITH DOFETILIDE THERAPY WILL REQUIRE [place]

## 2022-07-09 ENCOUNTER — Telehealth: Payer: Self-pay | Admitting: Internal Medicine

## 2022-07-09 NOTE — Telephone Encounter (Signed)
  Pt is calling, she decided to get the dofetilide therapy and would like to get a schedule in January.

## 2022-07-10 ENCOUNTER — Telehealth: Payer: Self-pay | Admitting: Pharmacist

## 2022-07-10 NOTE — Telephone Encounter (Signed)
Medication list reviewed in anticipation of upcoming Tikosyn initiation. Patient is not taking any contraindicated or QTc prolonging medications.   Patient is anticoagulated on Eliquis 5mg BID on the appropriate dose. Please ensure that patient has not missed any anticoagulation doses in the 3 weeks prior to Tikosyn initiation.   Patient will need to be counseled to avoid use of Benadryl while on Tikosyn and in the 2-3 days prior to Tikosyn initiation. 

## 2022-07-10 NOTE — Telephone Encounter (Signed)
Pt called back regarding message received wanting to start dofetilide therapy.    Pt stated she thought about it, and now wants to start dofetilide, knowing it requires a hospital admission.  An attempt at medication education was made.  Pt told that I will forward her request to the AFIB clinic to have her appointment scheduled.   Message sent to Ms. Stacy RN for an appointment sometime in January.

## 2022-07-22 DIAGNOSIS — R000 Tachycardia, unspecified: Secondary | ICD-10-CM | POA: Diagnosis not present

## 2022-07-24 DIAGNOSIS — I700 Atherosclerosis of aorta: Secondary | ICD-10-CM | POA: Diagnosis not present

## 2022-07-24 DIAGNOSIS — K219 Gastro-esophageal reflux disease without esophagitis: Secondary | ICD-10-CM | POA: Diagnosis not present

## 2022-07-24 DIAGNOSIS — E7800 Pure hypercholesterolemia, unspecified: Secondary | ICD-10-CM | POA: Diagnosis not present

## 2022-07-24 DIAGNOSIS — I10 Essential (primary) hypertension: Secondary | ICD-10-CM | POA: Diagnosis not present

## 2022-08-11 ENCOUNTER — Encounter (HOSPITAL_COMMUNITY): Payer: Self-pay

## 2022-08-12 ENCOUNTER — Other Ambulatory Visit: Payer: Self-pay

## 2022-08-12 ENCOUNTER — Ambulatory Visit (HOSPITAL_COMMUNITY)
Admission: RE | Admit: 2022-08-12 | Discharge: 2022-08-12 | Disposition: A | Discharge status: Home / Self Care | Payer: Medicare Other | Source: Ambulatory Visit | Attending: Physician Assistant | Admitting: Physician Assistant

## 2022-08-12 ENCOUNTER — Inpatient Hospital Stay (HOSPITAL_COMMUNITY)
Admission: RE | Admit: 2022-08-12 | Discharge: 2022-08-15 | DRG: 310 | Disposition: A | Discharge status: Home / Self Care | Payer: Medicare Other | Source: Ambulatory Visit | Attending: Internal Medicine | Admitting: Internal Medicine

## 2022-08-12 VITALS — BP 144/76 | HR 86 | Ht 67.0 in | Wt 155.4 lb

## 2022-08-12 DIAGNOSIS — Z79899 Other long term (current) drug therapy: Secondary | ICD-10-CM | POA: Diagnosis not present

## 2022-08-12 DIAGNOSIS — M1990 Unspecified osteoarthritis, unspecified site: Secondary | ICD-10-CM | POA: Diagnosis present

## 2022-08-12 DIAGNOSIS — Z7901 Long term (current) use of anticoagulants: Secondary | ICD-10-CM | POA: Diagnosis not present

## 2022-08-12 DIAGNOSIS — I440 Atrioventricular block, first degree: Secondary | ICD-10-CM | POA: Diagnosis not present

## 2022-08-12 DIAGNOSIS — Z96652 Presence of left artificial knee joint: Secondary | ICD-10-CM | POA: Diagnosis present

## 2022-08-12 DIAGNOSIS — I484 Atypical atrial flutter: Secondary | ICD-10-CM | POA: Insufficient documentation

## 2022-08-12 DIAGNOSIS — Z66 Do not resuscitate: Secondary | ICD-10-CM | POA: Diagnosis present

## 2022-08-12 DIAGNOSIS — I10 Essential (primary) hypertension: Secondary | ICD-10-CM | POA: Diagnosis not present

## 2022-08-12 DIAGNOSIS — Z800 Family history of malignant neoplasm of digestive organs: Secondary | ICD-10-CM | POA: Diagnosis not present

## 2022-08-12 DIAGNOSIS — I4819 Other persistent atrial fibrillation: Secondary | ICD-10-CM | POA: Diagnosis not present

## 2022-08-12 DIAGNOSIS — Z86718 Personal history of other venous thrombosis and embolism: Secondary | ICD-10-CM

## 2022-08-12 DIAGNOSIS — D6869 Other thrombophilia: Secondary | ICD-10-CM

## 2022-08-12 LAB — BASIC METABOLIC PANEL
Anion gap: 10 (ref 5–15)
BUN: 17 mg/dL (ref 8–23)
CO2: 30 mmol/L (ref 22–32)
Calcium: 8.6 mg/dL — ABNORMAL LOW (ref 8.9–10.3)
Chloride: 91 mmol/L — ABNORMAL LOW (ref 98–111)
Creatinine, Ser: 0.74 mg/dL (ref 0.44–1.00)
GFR, Estimated: >60 mL/min (ref >60)
Glucose, Bld: 99 mg/dL (ref 70–99)
Potassium: 4 mmol/L (ref 3.5–5.1)
Sodium: 131 mmol/L — ABNORMAL LOW (ref 135–145)

## 2022-08-12 LAB — MAGNESIUM: Magnesium: 2.1 mg/dL (ref 1.7–2.4)

## 2022-08-12 MED ORDER — SODIUM CHLORIDE 0.9 % IV SOLN: 250.0000 mL | INTRAVENOUS | Status: DC | PRN

## 2022-08-12 MED ORDER — FUROSEMIDE 20 MG PO TABS
20.0000 mg | ORAL_TABLET | Freq: Two times a day (BID) | ORAL | Status: DC
  Administered 2022-08-13 – 2022-08-15 (×5): 20 mg via ORAL
  Filled 2022-08-12 (×5): qty 1

## 2022-08-12 MED ORDER — SODIUM CHLORIDE 0.9% FLUSH: 3.0000 mL | INTRAVENOUS | Status: DC | PRN

## 2022-08-12 MED ORDER — IRBESARTAN 150 MG PO TABS
300.0000 mg | ORAL_TABLET | Freq: Every day | ORAL | Status: DC
  Administered 2022-08-13 – 2022-08-15 (×3): 300 mg via ORAL
  Filled 2022-08-12 (×3): qty 2

## 2022-08-12 MED ORDER — SODIUM CHLORIDE 0.9% FLUSH
3.0000 mL | Freq: Two times a day (BID) | INTRAVENOUS | Status: DC
  Administered 2022-08-13 – 2022-08-14 (×2): 3 mL via INTRAVENOUS

## 2022-08-12 MED ORDER — DILTIAZEM HCL 60 MG PO TABS
120.0000 mg | ORAL_TABLET | Freq: Every morning | ORAL | Status: DC
  Administered 2022-08-13 – 2022-08-15 (×3): 120 mg via ORAL
  Filled 2022-08-12 (×3): qty 2

## 2022-08-12 MED ORDER — SODIUM CHLORIDE (HYPERTONIC) 5 % OP SOLN: 1.0000 [drp] | OPHTHALMIC | Status: DC | PRN

## 2022-08-12 MED ORDER — POLYETHYLENE GLYCOL 3350 17 G PO PACK
17.0000 g | PACK | Freq: Two times a day (BID) | ORAL | Status: DC
  Administered 2022-08-13 – 2022-08-15 (×4): 17 g via ORAL
  Filled 2022-08-12 (×4): qty 1

## 2022-08-12 MED ORDER — APIXABAN 5 MG PO TABS
5.0000 mg | ORAL_TABLET | Freq: Two times a day (BID) | ORAL | Status: DC
  Administered 2022-08-12 – 2022-08-15 (×6): 5 mg via ORAL
  Filled 2022-08-12 (×6): qty 1

## 2022-08-12 MED ORDER — CARVEDILOL 25 MG PO TABS
25.0000 mg | ORAL_TABLET | Freq: Two times a day (BID) | ORAL | Status: DC
  Administered 2022-08-12 – 2022-08-15 (×6): 25 mg via ORAL
  Filled 2022-08-12 (×6): qty 1

## 2022-08-12 MED ORDER — BUSPIRONE HCL 5 MG PO TABS
5.0000 mg | ORAL_TABLET | Freq: Two times a day (BID) | ORAL | Status: DC
  Administered 2022-08-12 – 2022-08-15 (×6): 5 mg via ORAL
  Filled 2022-08-12 (×6): qty 1

## 2022-08-12 MED ORDER — NAPHAZOLINE-GLYCERIN 0.012-0.25 % OP SOLN: 1.0000 [drp] | Freq: Four times a day (QID) | OPHTHALMIC | Status: DC | PRN

## 2022-08-12 MED ORDER — DOFETILIDE 250 MCG PO CAPS
250.0000 ug | ORAL_CAPSULE | Freq: Two times a day (BID) | ORAL | Status: DC
  Administered 2022-08-12 – 2022-08-15 (×6): 250 ug via ORAL
  Filled 2022-08-12 (×6): qty 1

## 2022-08-12 MED ORDER — VITAMIN D 25 MCG (1000 UNIT) PO TABS
2000.0000 [IU] | ORAL_TABLET | Freq: Every day | ORAL | Status: DC
  Administered 2022-08-13 – 2022-08-15 (×3): 2000 [IU] via ORAL
  Filled 2022-08-12 (×3): qty 2

## 2022-08-12 NOTE — Progress Notes (Signed)
Primary Care Physician: Lajean Manes, MD Referring Physician: ED f/u Primary EP: Dr Jalene Mullet Michelle Mosley is a 89 y.o. female with a h/o HTN, DVT rt leg, LEE, osteoarthritis of both knees, newly dx paroxsymal afib that is in the Afib clinic for f/u ED visit. She had seen her PCP for tachycardia and LEE. Labs drawn showed a BNP of 500 and a troponin of 20 so she was called at home and told to go to the ED. She was started on eliquis at PCP as EKG showed atrial flutter.she was already on daily lasix but it was increased in the ED for suspicion  of mild CHF. She  did not fit the picture of ACS.   She was asked to f/u in the Afib clinic. Today, she is in rate controlled atrial flutter. She states she lost  6-7 lbs with increase of lasix to 40 mg a day and her LEE is much improved. Her rt leg usually swells more that the left for arthritis of the knee. She does not describe any dyspnea. She is wearing a 2 week zio patch that was placed at the PCP office that will come off 12/6. She has a previously scheduled appointment pending with Dr. Beckie Salts 06/27/22. She is compliant with eliquis. She was already on carvedilol 25 mg bid and diltiazem 120 mg daily.   Follow up in the AF clinic 08/12/22. Patient presents for dofetilide admission today. She is in SR today. She denies any missed doses of anticoagulation.   Today, she denies symptoms of palpitations, chest pain, shortness of breath, orthopnea, PND, lower extremity edema, dizziness, presyncope, syncope, or neurologic sequela. The patient is tolerating medications without difficulties and is otherwise without complaint today.   Past Medical History:  Diagnosis Date   Arthritis    DVT (deep venous thrombosis) ([place])    Hypertension    Past Surgical History:  Procedure Laterality Date   broken neck  [place]   excision of breast cyst Left    TOTAL KNEE ARTHROPLASTY Left 04/01/2019   Procedure: TOTAL KNEE  ARTHROPLASTY;  Surgeon: [name], MD;  Location: [facility];  Service: Orthopedics;  Laterality: Left;  70 mins    Current Outpatient Medications  Medication Sig Dispense Refill   busPIRone (BUSPAR) 5 MG tablet Take 5 mg by mouth 2 (two) times daily.     CARTIA XT 120 MG 24 hr capsule Take 120 mg by mouth daily.     carvedilol (COREG) 25 MG tablet Take 25 mg by mouth 2 (two) times daily with a meal.     Cholecalciferol (VITAMIN D) 2000 units CAPS Take 1 capsule by mouth daily.     diltiazem (CARDIZEM) 120 MG tablet Take 120 mg by mouth every morning.     ELIQUIS 5 MG TABS tablet Take 5 mg by mouth 2 (two) times daily.     ESTRADIOL PO Take by mouth as needed. Vaginal cream- three times weekly     furosemide (LASIX) 20 MG tablet Take 20 mg by mouth 2 (two) times daily.     metroNIDAZOLE (METROCREAM) 0.75 % cream as needed. For her face     polyethylene glycol (MIRALAX / GLYCOLAX) 17 g packet Take 17 g by mouth 2 (two) times daily. 28 packet 0   sodium chloride (CVS SODIUM CHLORIDE) 5 % ophthalmic solution Place 1 drop into the right eye every 4 (four) hours as needed for eye irritation.  tetrahydrozoline 0.05 % ophthalmic solution Place 1 drop into both eyes as needed. [employer] brand     valsartan (DIOVAN) 320 MG tablet Take 320 mg by mouth daily.     No current facility-administered medications for this encounter.    No Known Allergies  Social History   Socioeconomic History   Marital status: Widowed    Spouse name: Not on file   Number of children: Not on file   Years of education: Not on file   Highest education level: Not on file  Occupational History   Not on file  Tobacco Use   Smoking status: Never   Smokeless tobacco: Never   Tobacco comments:    Never smoke 06/11/22  Vaping Use   Vaping Use: Never used  Substance and Sexual Activity   Alcohol use: No   Drug use: Never   Sexual activity: Not on file  Other Topics Concern   Not on file  Social History Narrative    Not on file   Social Determinants of Health   Financial Resource Strain: Not on file  Food Insecurity: Not on file  Transportation Needs: Not on file  Physical Activity: Not on file  Stress: Not on file  Social Connections: Not on file  Intimate Partner Violence: Not on file    Family History  Problem Relation Age of Onset   Colon cancer Mother     ROS- All systems are reviewed and negative except as per the HPI above  Physical Exam: Vitals:   08/12/22 1101  BP: (!) 144/76  Pulse: 86  Weight: 70.5 kg  Height: '5\' 7"'$  (1.702 m)   Wt Readings from Last 3 Encounters:  08/12/22 70.5 kg  06/27/22 71.3 kg  06/11/22 70 kg    Labs: Lab Results  Component Value Date   NA 132 (L) 06/11/2022   K 4.9 06/11/2022   CL 94 (L) 06/11/2022   CO2 30 06/11/2022   GLUCOSE 94 06/11/2022   BUN 16 06/11/2022   CREATININE 1.08 (H) 06/11/2022   CALCIUM 8.8 (L) 06/11/2022   Lab Results  Component Value Date   INR 1.0 04/16/2022   No results found for: "CHOL", "HDL", "LDLCALC", "TRIG"   GEN- The patient is a well appearing elderly female, alert and oriented x 3 today.   HEENT-head normocephalic, atraumatic, sclera clear, conjunctiva pink, hearing intact, trachea midline. Lungs- Clear to ausculation bilaterally, normal work of breathing Heart- Regular rate and rhythm, no murmurs, rubs or gallops  GI- soft, NT, ND, + BS Extremities- no clubbing, cyanosis, or edema MS- no significant deformity or atrophy Skin- no rash or lesion Psych- euthymic mood, full affect Neuro- strength and sensation are intact   EKG- SR, 1st degree AV block Vent. rate 86 BPM PR interval 238 ms QRS duration 78 ms QT/QTcB 376/449 ms   Echo-  04/04/19-1. Left ventricular ejection fraction, by visual estimation, is 55 to 60%. The left ventricle has normal function. Normal left ventricular size. There is mildly increased left ventricular hypertrophy.   2. Left ventricular diastolic Doppler parameters are  consistent with  impaired relaxation pattern of LV diastolic filling.   3. Global right ventricle has normal systolic function.The right  ventricular size is normal. No increase in right ventricular wall  thickness.   4. Left atrial size was normal.   5. Right atrial size was normal.   6. Moderate aortic valve annular calcification.   7. The mitral valve is grossly normal. Trace mitral valve regurgitation.  8. The tricuspid valve is abnormal. Tricuspid valve regurgitation  moderate-severe.   9. The tricuspid valve is thickened and appears to incompletely coapt.  10. The aortic valve is tricuspid Aortic valve regurgitation was not  visualized by color flow Doppler.  11. The pulmonic valve was grossly normal. Pulmonic valve regurgitation is  trivial by color flow Doppler.  12. Normal pulmonary artery systolic pressure.  13. The tricuspid regurgitant velocity is 2.52 m/s, and with an assumed  right atrial pressure of 3 mmHg, the estimated right ventricular systolic  pressure is normal at 28.4 mmHg.  14. The inferior vena cava is normal in size with greater than 50%  respiratory variability, suggesting right atrial pressure of 3 mmHg.    CHA2DS2-VASc Score = 5  The patient's score is based upon: CHF History: 1 HTN History: 1 Diabetes History: 0 Stroke History: 0 Vascular Disease History: 0 Age Score: 2 Gender Score: 1       ASSESSMENT AND PLAN: 1. Atypical atrial flutter The patient's CHA2DS2-VASc score is 5, indicating a 7.2% annual risk of stroke.   Patient wants to pursue dofetilide. Continue Eliquis 5 mg BID, states no missed doses in the last 3 weeks. No recent benadryl use PharmD has screened medications QTc in SR 449 ms Labs today show creatinine at 0.74, K+ 4.0 and mag 2.1, CrCl calculated at 59 mL/min Continue carvedilol 25 mg BID Continue diltiazem 120 mg daily  2. Secondary Hypercoagulable State (ICD10:  D68.69) The patient is at significant risk for  stroke/thromboembolism based upon her CHA2DS2-VASc Score of 5.  Continue Apixaban (Eliquis).   3. HTN  Stable, no changes today.   To be admitted later today once a bed becomes available.    [facility] [address] [place], [place] [address] [phone]

## 2022-08-12 NOTE — H&P (Signed)
Primary Care Physician: Lajean Manes, MD Referring Physician: ED f/u Primary EP: Dr Jalene Mullet Michelle Mosley is a 89 y.o. female with a h/o HTN, DVT rt leg, LEE, osteoarthritis of both knees, newly dx paroxsymal afib that is in the Afib clinic for f/u ED visit. She had seen her PCP for tachycardia and LEE. Labs drawn showed a BNP of 500 and a troponin of 20 so she was called at home and told to go to the ED. She was started on eliquis at PCP as EKG showed atrial flutter.she was already on daily lasix but it was increased in the ED for suspicion  of mild CHF. She  did not fit the picture of ACS.    She was asked to f/u in the Afib clinic. Today, she is in rate controlled atrial flutter. She states she lost  6-7 lbs with increase of lasix to 40 mg a day and her LEE is much improved. Her rt leg usually swells more that the left for arthritis of the knee. She does not describe any dyspnea. She is wearing a 2 week zio patch that was placed at the PCP office that will come off 12/6. She has a previously scheduled appointment pending with Dr. Beckie Salts 06/27/22. She is compliant with eliquis. She was already on carvedilol 25 mg bid and diltiazem 120 mg daily.    Follow up in the AF clinic 08/12/22. Patient presents for dofetilide admission today. She is in SR today. She denies any missed doses of anticoagulation.    Today, she denies symptoms of palpitations, chest pain, shortness of breath, orthopnea, PND, lower extremity edema, dizziness, presyncope, syncope, or neurologic sequela. The patient is tolerating medications without difficulties and is otherwise without complaint today.        Past Medical History:  Diagnosis Date   Arthritis     DVT (deep venous thrombosis) ([place])     Hypertension           Past Surgical History:  Procedure Laterality Date   broken neck   [facility]   excision of breast cyst Left     TOTAL KNEE ARTHROPLASTY Left 04/01/2019    Procedure:  TOTAL KNEE ARTHROPLASTY;  Surgeon: [name], MD;  Location: [facility];  Service: Orthopedics;  Laterality: Left;  70 mins            Current Outpatient Medications  Medication Sig Dispense Refill   busPIRone (BUSPAR) 5 MG tablet Take 5 mg by mouth 2 (two) times daily.       CARTIA XT 120 MG 24 hr capsule Take 120 mg by mouth daily.       carvedilol (COREG) 25 MG tablet Take 25 mg by mouth 2 (two) times daily with a meal.       Cholecalciferol (VITAMIN D) 2000 units CAPS Take 1 capsule by mouth daily.       diltiazem (CARDIZEM) 120 MG tablet Take 120 mg by mouth every morning.       ELIQUIS 5 MG TABS tablet Take 5 mg by mouth 2 (two) times daily.       ESTRADIOL PO Take by mouth as needed. Vaginal cream- three times weekly       furosemide (LASIX) 20 MG tablet Take 20 mg by mouth 2 (two) times daily.       metroNIDAZOLE (METROCREAM) 0.75 % cream as needed. For her face  polyethylene glycol (MIRALAX / GLYCOLAX) 17 g packet Take 17 g by mouth 2 (two) times daily. 28 packet 0   sodium chloride ([employer]S SODIUM CHLORIDE) 5 % ophthalmic solution Place 1 drop into the right eye every 4 (four) hours as needed for eye irritation.       tetrahydrozoline 0.05 % ophthalmic solution Place 1 drop into both eyes as needed. [employer] brand       valsartan (DIOVAN) 320 MG tablet Take 320 mg by mouth daily.        No current facility-administered medications for this encounter.      No Known Allergies   Social History         Socioeconomic History   Marital status: Widowed      Spouse name: Not on file   Number of children: Not on file   Years of education: Not on file   Highest education level: Not on file  Occupational History   Not on file  Tobacco Use   Smoking status: Never   Smokeless tobacco: Never   Tobacco comments:      Never smoke 06/11/22  Vaping Use   Vaping Use: Never used  Substance and Sexual Activity   Alcohol use: No   Drug use: Never   Sexual activity: Not on file   Other Topics Concern   Not on file  Social History Narrative   Not on file    Social Determinants of Health    Financial Resource Strain: Not on file  Food Insecurity: Not on file  Transportation Needs: Not on file  Physical Activity: Not on file  Stress: Not on file  Social Connections: Not on file  Intimate Partner Violence: Not on file           Family History  Problem Relation Age of Onset   Colon cancer Mother        ROS- All systems are reviewed and negative except as per the HPI above   Physical Exam:    Vitals:    08/12/22 1101  BP: (!) 144/76  Pulse: 86  Weight: 70.5 kg  Height: '5\' 7"'$  (1.702 m)       Wt Readings from Last 3 Encounters:  08/12/22 70.5 kg  06/27/22 71.3 kg  06/11/22 70 kg      Labs: Recent Labs       Lab Results  Component Value Date    NA 132 (L) 06/11/2022    K 4.9 06/11/2022    CL 94 (L) 06/11/2022    CO2 30 06/11/2022    GLUCOSE 94 06/11/2022    BUN 16 06/11/2022    CREATININE 1.08 (H) 06/11/2022    CALCIUM 8.8 (L) 06/11/2022      Recent Labs       Lab Results  Component Value Date    INR 1.0 04/16/2022      Recent Labs  No results found for: "CHOL", "HDL", "LDLCALC", "TRIG"       GEN- The patient is a well appearing elderly female, alert and oriented x 3 today.   HEENT-head normocephalic, atraumatic, sclera clear, conjunctiva pink, hearing intact, trachea midline. Lungs- Clear to ausculation bilaterally, normal work of breathing Heart- Regular rate and rhythm, no murmurs, rubs or gallops  GI- soft, NT, ND, + BS Extremities- no clubbing, cyanosis, or edema MS- no significant deformity or atrophy Skin- no rash or lesion Psych- euthymic mood, full affect Neuro- strength and sensation are intact     EKG- SR, 1st  degree AV block Vent. rate 86 BPM PR interval 238 ms QRS duration 78 ms QT/QTcB 376/449 ms     Echo-  04/04/19-1. Left ventricular ejection fraction, by visual estimation, is 55 to 60%. The left  ventricle has normal function. Normal left ventricular size. There is mildly increased left ventricular hypertrophy.   2. Left ventricular diastolic Doppler parameters are consistent with  impaired relaxation pattern of LV diastolic filling.   3. Global right ventricle has normal systolic function.The right  ventricular size is normal. No increase in right ventricular wall  thickness.   4. Left atrial size was normal.   5. Right atrial size was normal.   6. Moderate aortic valve annular calcification.   7. The mitral valve is grossly normal. Trace mitral valve regurgitation.   8. The tricuspid valve is abnormal. Tricuspid valve regurgitation  moderate-severe.   9. The tricuspid valve is thickened and appears to incompletely coapt.  10. The aortic valve is tricuspid Aortic valve regurgitation was not  visualized by color flow Doppler.  11. The pulmonic valve was grossly normal. Pulmonic valve regurgitation is  trivial by color flow Doppler.  12. Normal pulmonary artery systolic pressure.  13. The tricuspid regurgitant velocity is 2.52 m/s, and with an assumed  right atrial pressure of 3 mmHg, the estimated right ventricular systolic  pressure is normal at 28.4 mmHg.  14. The inferior vena cava is normal in size with greater than 50%  respiratory variability, suggesting right atrial pressure of 3 mmHg.      CHA2DS2-VASc Score = 5  The patient's score is based upon: CHF History: 1 HTN History: 1 Diabetes History: 0 Stroke History: 0 Vascular Disease History: 0 Age Score: 2 Gender Score: 1         ASSESSMENT AND PLAN: 1. Atypical atrial flutter The patient's CHA2DS2-VASc score is 5, indicating a 7.2% annual risk of stroke.   Patient wants to pursue dofetilide. Continue Eliquis 5 mg BID, states no missed doses in the last 3 weeks. No recent benadryl use PharmD has screened medications QTc in SR 449 ms Labs today show creatinine at 0.74, K+ 4.0 and mag 2.1, CrCl calculated at  59 mL/min Continue carvedilol 25 mg BID Continue diltiazem 120 mg daily   2. Secondary Hypercoagulable State (ICD10:  D68.69) The patient is at significant risk for stroke/thromboembolism based upon her CHA2DS2-VASc Score of 5.  Continue Apixaban (Eliquis).    3. HTN  Stable, no changes today.     To be admitted later today once a bed becomes available.      [facility] [address] [place], [place] [address] [phone]           EP Attending  Patient seen and examined. Agree with above. The patient presents fo initiation of dofetilide due to PAF. She denies missing her blood thinner and her cardiac meds. Her QT is acceptable but borderline. She will be admitted and observed. We will follow her electrolytes and her QT interval. As she is in NSR, no plans for DCCV.  [name] Abdinasir Spadafore,MD

## 2022-08-12 NOTE — H&P (Incomplete)
Electrophysiology H&P  Note   Primary Care Physician: Lajean Manes, MD Referring Physician: ED f/u Primary EP: Dr Jalene Mullet Michelle Mosley is a 89 y.o. female with a h/o HTN, DVT rt leg, LEE, osteoarthritis of both knees, newly dx paroxsymal afib that is in the Afib clinic for f/u ED visit. She had seen her PCP for tachycardia and LEE. Labs drawn showed a BNP of 500 and a troponin of 20 so she was called at home and told to go to the ED. She was started on eliquis at PCP as EKG showed atrial flutter.she was already on daily lasix but it was increased in the ED for suspicion  of mild CHF. She  did not fit the picture of ACS.   She was asked to f/u in the Afib clinic. Today, she is in rate controlled atrial flutter. She states she lost  6-7 lbs with increase of lasix to 40 mg a day and her LEE is much improved. Her rt leg usually swells more that the left for arthritis of the knee. She does not describe any dyspnea. She is wearing a 2 week zio patch that was placed at the PCP office that will come off 12/6. She has a previously scheduled appointment pending with Dr. Beckie Salts 06/27/22. She is compliant with eliquis. She was already on carvedilol 25 mg bid and diltiazem 120 mg daily.   Follow up in the AF clinic 08/12/22. Patient presents for dofetilide admission today. She is in SR today. She denies any missed doses of anticoagulation.   Today, she denies symptoms of palpitations, chest pain, shortness of breath, orthopnea, PND, lower extremity edema, dizziness, presyncope, syncope, or neurologic sequela. The patient is tolerating medications without difficulties and is otherwise without complaint today.   No current facility-administered medications for this encounter.   Current Outpatient Medications  Medication Sig Dispense Refill   busPIRone (BUSPAR) 5 MG tablet Take 5 mg by mouth 2 (two) times daily.     CARTIA XT 120 MG 24 hr capsule Take 120 mg by mouth daily.     carvedilol (COREG) 25  MG tablet Take 25 mg by mouth 2 (two) times daily with a meal.     Cholecalciferol (VITAMIN D) 2000 units CAPS Take 1 capsule by mouth daily.     diltiazem (CARDIZEM) 120 MG tablet Take 120 mg by mouth every morning.     ELIQUIS 5 MG TABS tablet Take 5 mg by mouth 2 (two) times daily.     ESTRADIOL PO Take by mouth as needed. Vaginal cream- three times weekly     furosemide (LASIX) 20 MG tablet Take 20 mg by mouth 2 (two) times daily.     metroNIDAZOLE (METROCREAM) 0.75 % cream as needed. For her face     polyethylene glycol (MIRALAX / GLYCOLAX) 17 g packet Take 17 g by mouth 2 (two) times daily. 28 packet 0   sodium chloride ([employer]S SODIUM CHLORIDE) 5 % ophthalmic solution Place 1 drop into the right eye every 4 (four) hours as needed for eye irritation.     tetrahydrozoline 0.05 % ophthalmic solution Place 1 drop into both eyes as needed. [employer] brand     valsartan (DIOVAN) 320 MG tablet Take 320 mg by mouth daily.      No Known Allergies  Allergies, Past Medical, Surgical, Social, and Family Histories have been reviewed and are referenced here-in when relevant for medical decision making.   Physical Exam: Vitals:  08/12/22 1101  BP: (!) 144/76  Pulse: 86  Weight: 70.5 kg  Height: '5\' 7"'$  (1.702 m)       Wt Readings from Last 3 Encounters:  08/12/22 70.5 kg  06/27/22 71.3 kg  06/11/22 70 kg   General: Pleasant, NAD. No resp difficulty Psych: Normal affect. HEENT:  Normal, without mass or lesion.         Neck: Supple, no bruits or JVD. Carotids 2+. No lymphadenopathy/thyromegaly appreciated. Heart: PMI nondisplaced. RRR no s3, s4, or murmurs. Lungs:  Resp regular and unlabored, CTA. Abdomen: Soft, non-tender, non-distended, No HSM, BS + x 4.   Extremities: No clubbing, cyanosis or edema. DP/PT/Radials 2+ and equal bilaterally. Neuro: Alert and oriented X 3. Moves all extremities spontaneously.  EKG- SR, 1st degree AV block Vent. rate 86 BPM PR interval 238 ms QRS duration  78 ms QT/QTcB 376/449 ms   Echo-  04/04/19-1. Left ventricular ejection fraction, by visual estimation, is 55 to 60%. The left ventricle has normal function. Normal left ventricular size. There is mildly increased left ventricular hypertrophy.   2. Left ventricular diastolic Doppler parameters are consistent with  impaired relaxation pattern of LV diastolic filling.   3. Global right ventricle has normal systolic function.The right  ventricular size is normal. No increase in right ventricular wall  thickness.   4. Left atrial size was normal.   5. Right atrial size was normal.   6. Moderate aortic valve annular calcification.   7. The mitral valve is grossly normal. Trace mitral valve regurgitation.   8. The tricuspid valve is abnormal. Tricuspid valve regurgitation  moderate-severe.   9. The tricuspid valve is thickened and appears to incompletely coapt.  10. The aortic valve is tricuspid Aortic valve regurgitation was not  visualized by color flow Doppler.  11. The pulmonic valve was grossly normal. Pulmonic valve regurgitation is  trivial by color flow Doppler.  12. Normal pulmonary artery systolic pressure.  13. The tricuspid regurgitant velocity is 2.52 m/s, and with an assumed  right atrial pressure of 3 mmHg, the estimated right ventricular systolic  pressure is normal at 28.4 mmHg.  14. The inferior vena cava is normal in size with greater than 50%  respiratory variability, suggesting right atrial pressure of 3 mmHg.    CHA2DS2-VASc Score = 5  The patient's score is based upon: CHF History: 1 HTN History: 1 Diabetes History: 0 Stroke History: 0 Vascular Disease History: 0 Age Score: 2 Gender Score: 1       ASSESSMENT AND PLAN: 1. Atypical atrial flutter The patient's CHA2DS2-VASc score is 5, indicating a 7.2% annual risk of stroke.   Patient wants to pursue dofetilide. Continue Eliquis 5 mg BID, states no missed doses in the last 3 weeks. No recent benadryl  use PharmD has screened medications QTc in SR 449 ms Labs today show creatinine at 0.74, K+ 4.0 and mag 2.1, CrCl calculated at 59 mL/min Continue carvedilol 25 mg BID Continue diltiazem 120 mg daily  2. Secondary Hypercoagulable State (ICD10:  D68.69) The patient is at significant risk for stroke/thromboembolism based upon her CHA2DS2-VASc Score of 5.  Continue Apixaban (Eliquis).   3. HTN  Stable, no changes today.  Pt presents today for Tikosyn admission as above.   Legrand Como [address] [place], [place]  08/12/2022 3:37 PM

## 2022-08-12 NOTE — Progress Notes (Signed)
Pharmacy: Dofetilide (Tikosyn) - Initial Consult Assessment and Electrolyte Replacement  Pharmacy consulted to assist in monitoring and replacing electrolytes in this 89 y.o. female admitted on 08/12/2022 undergoing dofetilide initiation. First dofetilide dose: '250mg'$   Assessment:  Patient Exclusion Criteria: If any screening criteria checked as "Yes", then  patient  should NOT receive dofetilide until criteria item is corrected.  If "Yes" please indicate correction plan.  YES  NO Patient  Exclusion Criteria Correction Plan   '[x]'$   '[]'$   Baseline QTc interval is greater than or equal to 440 msec. IF above YES box checked dofetilide contraindicated unless patient has ICD; then may proceed if QTc 500-550 msec or with known ventricular conduction abnormalities may proceed with QTc 550-600 msec. QTc =  449, EP aware 449 EP aware and OK   '[]'$   '[x]'$   Patient is known or suspected to have a digoxin level greater than 2 ng/ml: No results found for: "DIGOXIN"     '[]'$   '[x]'$   Creatinine clearance less than 20 ml/min (calculated using Cockcroft-Gault, actual body weight and serum creatinine): Estimated Creatinine Clearance: 48.2 mL/min (by C-G formula based on SCr of 0.74 mg/dL).     '[]'$   '[x]'$  Patient has received drugs known to prolong the QT intervals within the last 48 hours (phenothiazines, tricyclics or tetracyclic antidepressants, erythromycin, H-1 antihistamines, cisapride, fluoroquinolones, azithromycin, ondansetron).   Updated information on QT prolonging agents is available to be searched on the following database:QT prolonging agents     '[]'$   '[x]'$   Patient received a dose of hydrochlorothiazide (Oretic) alone or in any combination including triamterene (Dyazide, Maxzide) in the last 48 hours.    '[]'$   '[x]'$  Patient received a medication known to increase dofetilide plasma concentrations prior to initial dofetilide dose:  Trimethoprim (Primsol, Proloprim) in the last 36 hours Verapamil (Calan,  Verelan) in the last 36 hours or a sustained release dose in the last 72 hours Megestrol (Megace) in the last 5 days  Cimetidine (Tagamet) in the last 6 hours Ketoconazole (Nizoral) in the last 24 hours Itraconazole (Sporanox) in the last 48 hours  Prochlorperazine (Compazine) in the last 36 hours     '[]'$   '[x]'$   Patient is known to have a history of torsades de pointes; congenital or acquired long QT syndromes.    '[]'$   '[x]'$   Patient has received a Class 1 antiarrhythmic with less than 2 half-lives since last dose. (Disopyramide, Quinidine, Procainamide, Lidocaine, Mexiletine, Flecainide, Propafenone)    '[]'$   '[x]'$   Patient has received amiodarone therapy in the past 3 months or amiodarone level is greater than 0.3 ng/ml.    Labs:    Component Value Date/Time   K 4.0 08/12/2022 1101   MG 2.1 08/12/2022 1101     Plan: Select One Calculated CrCl  Dose q12h  '[]'$  > 60 ml/min 500 mcg  '[x]'$  40-60 ml/min 250 mcg  '[]'$  20-40 ml/min 125 mcg   '[x]'$   Physician selected initial dose within range recommended for patients level of renal function - will monitor for response.  '[]'$   Physician selected initial dose outside of range recommended for patients level of renal function - will discuss if the dose should be altered at this time.   Patient has been appropriately anticoagulated with Eliquis.  Potassium: K >/= 4: Appropriate to initiate Tikosyn, no replacement needed    Magnesium: Mg >2: Appropriate to initiate Tikosyn, no replacement needed     Thank you for allowing pharmacy to participate in this patient's care  Bertis Ruddy, PharmD, [facility] Clinical Pharmacist ED Pharmacist Phone # [phone] 08/12/2022 4:38 PM

## 2022-08-12 NOTE — Progress Notes (Signed)
[facility] Tikosyn EKG showing pt remains in SR, Qtc 432.

## 2022-08-13 ENCOUNTER — Other Ambulatory Visit (HOSPITAL_COMMUNITY): Payer: Self-pay

## 2022-08-13 DIAGNOSIS — I484 Atypical atrial flutter: Secondary | ICD-10-CM | POA: Diagnosis not present

## 2022-08-13 LAB — BASIC METABOLIC PANEL
Anion gap: 9 (ref 5–15)
BUN: 20 mg/dL (ref 8–23)
CO2: 29 mmol/L (ref 22–32)
Calcium: 8.6 mg/dL — ABNORMAL LOW (ref 8.9–10.3)
Chloride: 97 mmol/L — ABNORMAL LOW (ref 98–111)
Creatinine, Ser: 0.79 mg/dL (ref 0.44–1.00)
GFR, Estimated: >60 mL/min (ref >60)
Glucose, Bld: 85 mg/dL (ref 70–99)
Potassium: 3.9 mmol/L (ref 3.5–5.1)
Sodium: 135 mmol/L (ref 135–145)

## 2022-08-13 LAB — MAGNESIUM: Magnesium: 2.1 mg/dL (ref 1.7–2.4)

## 2022-08-13 MED ORDER — POTASSIUM CHLORIDE CRYS ER 20 MEQ PO TBCR
40.0000 meq | EXTENDED_RELEASE_TABLET | Freq: Once | ORAL | Status: AC
  Administered 2022-08-13: 40 meq via ORAL
  Filled 2022-08-13: qty 2

## 2022-08-13 NOTE — Progress Notes (Signed)
Pharmacy: Dofetilide (Tikosyn) - Follow Up Assessment and Electrolyte Replacement  Pharmacy consulted to assist in monitoring and replacing electrolytes in this 89 y.o. female admitted on 08/12/2022 undergoing dofetilide initiation. First dofetilide dose: 08/12/22  Labs:    Component Value Date/Time   K 3.9 08/13/2022 0330   MG 2.1 08/13/2022 0330     Plan: Potassium: K 3.8-3.9:  Give KCl 40 mEq po x1   Magnesium: Mg > 2: No additional supplementation needed   Arrie Senate, PharmD, BCPS, [facility] Clinical Pharmacist [phone] Please check AMION for all [facility] Pharmacy numbers 08/13/2022

## 2022-08-13 NOTE — Progress Notes (Signed)
Mobility Specialist - Progress Note   08/13/22 1525  Mobility  Activity Ambulated independently in hallway  Level of [place]  Distance Ambulated (ft) 470 ft  Activity Response Tolerated well  Mobility Referral No  $Mobility charge 1 Mobility   Pt was received in hall and agreeable. No complaints throughout. Pt was left in room with all needs met.   Franki Monte  Mobility Specialist Please contact via Solicitor or Rehab office at [phone]

## 2022-08-13 NOTE — Care Management (Signed)
  Transition of Care [facility]) Screening Note   Patient Details  Name: JEWELZ RICKLEFS Date of Birth: October 06, 1932   Transition of Care [facility]) CM/SW Contact:    Bethena Roys, RN Phone Number: 08/13/2022, 9:32 AM    Transition of Care Department [facility]) has reviewed the patient. Patient presented for Tikosyn Load. Benefits check submitted for cost $46.89. Case Manager will discuss cost with patient and pharmacy of choice as the patient progresses.

## 2022-08-13 NOTE — TOC Benefit Eligibility Note (Signed)
Patient Teacher, English as a foreign language completed.    The patient is currently admitted and upon discharge could be taking dofetilide (Tikosyn) 250 mcg capsules.  The current 30 day co-pay is $46.89.   The patient is insured through [place], [place] Patient Advocate Specialist [place] Patient Advocate Team Direct Number: [phone]  Fax: [phone]

## 2022-08-13 NOTE — Progress Notes (Signed)
Morning EKG reviewed     Shows remains in NSR with stable QTc at ~440 ms.  Pt did have 1 brief episode of monomorphic NSVT. Will review with Dr. Lovena Le  Continue  Tikosyn 250 mcg BID.   Potassium3.9 (01/30 0330) Magnesium  2.1 (01/30 0330) Creatinine, ser  0.79 (01/30 0330)  Plan for home Friday if QTc remains stable   Shirley Friar, [place]  08/13/2022 12:08 PM

## 2022-08-13 NOTE — Progress Notes (Addendum)
   Electrophysiology Rounding Note  Patient Name: Michelle Mosley Date of Encounter: 08/13/2022  Primary Cardiologist: None  Electrophysiologist: None    Subjective   Pt remains in NSR on Tikosyn 250 mcg BID   QTc from EKG last pm shows stable QTc at ~440-450  The patient is doing well today.  At this time, the patient denies chest pain, shortness of breath, or any new concerns.  Inpatient Medications    Scheduled Meds:  apixaban  5 mg Oral BID   busPIRone  5 mg Oral BID   carvedilol  25 mg Oral BID WC   cholecalciferol  2,000 Units Oral Daily   diltiazem  120 mg Oral q morning   dofetilide  250 mcg Oral BID   furosemide  20 mg Oral BID   irbesartan  300 mg Oral Daily   polyethylene glycol  17 g Oral BID   potassium chloride  40 mEq Oral Once   sodium chloride flush  3 mL Intravenous Q12H   Continuous Infusions:  sodium chloride     PRN Meds: sodium chloride, naphazoline-glycerin, sodium chloride, sodium chloride flush   Vital Signs    Vitals:   08/12/22 1641 08/12/22 1642 08/12/22 1937 08/13/22 0419  BP: (!) 157/76  (!) 117/59 (!) 162/76  Pulse: 86   79  Resp: '16  17 19  '$ Temp: 98.2 F (36.8 C)  97.7 F (36.5 C) 98.4 F (36.9 C)  TempSrc: Oral  Oral Oral  SpO2: 100%   96%  Weight:  70.4 kg    Height:  '5\' 6"'$  (1.676 m)     No intake or output data in the 24 hours ending 08/13/22 0709 Filed Weights   08/12/22 1642  Weight: 70.4 kg    Physical Exam    GEN- NAD, A&O x 3. Normal affect.  Lungs- CTAB, Normal effort.  Heart- Regular rate and rhythm. No M/G/R GI- Soft, NT, ND Extremities- No clubbing, cyanosis, or edema Skin- no rash or lesion  Labs    CBC No results for input(s): "WBC", "NEUTROABS", "HGB", "HCT", "MCV", "PLT" in the last 72 hours. Basic Metabolic Panel Recent Labs    08/12/22 1101 08/13/22 0330  NA 131* 135  K 4.0 3.9  CL 91* 97*  CO2 30 29  GLUCOSE 99 85  BUN 17 20  CREATININE 0.74 0.79  CALCIUM 8.6* 8.6*  MG 2.1 2.1     Telemetry    NSR 60s (personally reviewed)  Patient Profile     GABY HARNEY is a 89 y.o. female with a past medical history significant for persistent atrial fibrillation.  They were admitted for tikosyn load.   Assessment & Plan    Persistent atrial fibrillation Pt remains in NSR on Tikosyn 250 mcg BID  Continue Eliquis Creatinine, ser  0.79 (01/30 0330) Magnesium  2.1 (01/30 0330) Potassium3.9 (01/30 0330) Supplement K  Plan for home Thursday if QTc remains stable.   For questions or updates, please contact [place] Please consult [email or web address] for contact info under Cardiology/STEMI.  Signed, Shirley Friar, PA-C  08/13/2022, 7:09 AM   EP Attending  Patient seen and examined. Agree with above. The patient is maintaining NSR. Her QT is ok. Her electrolytes are ok.   [name] Whitten Andreoni,MD

## 2022-08-14 DIAGNOSIS — I484 Atypical atrial flutter: Secondary | ICD-10-CM | POA: Diagnosis not present

## 2022-08-14 LAB — BASIC METABOLIC PANEL
Anion gap: 7 (ref 5–15)
BUN: 23 mg/dL (ref 8–23)
CO2: 27 mmol/L (ref 22–32)
Calcium: 8.7 mg/dL — ABNORMAL LOW (ref 8.9–10.3)
Chloride: 97 mmol/L — ABNORMAL LOW (ref 98–111)
Creatinine, Ser: 0.88 mg/dL (ref 0.44–1.00)
GFR, Estimated: >60 mL/min (ref >60)
Glucose, Bld: 87 mg/dL (ref 70–99)
Potassium: 4.4 mmol/L (ref 3.5–5.1)
Sodium: 131 mmol/L — ABNORMAL LOW (ref 135–145)

## 2022-08-14 LAB — MAGNESIUM: Magnesium: 2.1 mg/dL (ref 1.7–2.4)

## 2022-08-14 NOTE — Progress Notes (Signed)
Morning EKG reviewed     Shows remains in NSR with stable QTc at ~440 ms.  Continue  Tikosyn 250 mcg BID.   Potassium4.4 (01/31 0352) Magnesium  2.1 (01/31 0352) Creatinine, ser  0.88 (01/31 0352)  Plan for home Thursday if QTc remains stable   Shirley Friar, [place]  08/14/2022 11:11 AM

## 2022-08-14 NOTE — Progress Notes (Signed)
Mobility Specialist - Progress Note   08/14/22 1208  Mobility  Activity Ambulated independently in hallway  Level of [place]  Distance Ambulated (ft) 470 ft  Activity Response Tolerated well  $Mobility charge 1 Mobility   Pt was received in hall and agreeable. No complaints throughout. Pt returned to room with all needs met.   Franki Monte  Mobility Specialist Please contact via Solicitor or Rehab office at [phone]

## 2022-08-14 NOTE — Progress Notes (Addendum)
Electrophysiology Rounding Note  Patient Name: Michelle Mosley Date of Encounter: 08/14/2022  Primary Cardiologist: None  Electrophysiologist: None    Subjective   Pt remains in NSR on Tikosyn 250 mcg BID   QTc from EKG last pm shows stable QTc at ~440 ms  The patient is doing well today.  At this time, the patient denies chest pain, shortness of breath, or any new concerns.  Inpatient Medications    Scheduled Meds:  apixaban  5 mg Oral BID   busPIRone  5 mg Oral BID   carvedilol  25 mg Oral BID WC   cholecalciferol  2,000 Units Oral Daily   diltiazem  120 mg Oral q morning   dofetilide  250 mcg Oral BID   furosemide  20 mg Oral BID   irbesartan  300 mg Oral Daily   polyethylene glycol  17 g Oral BID   sodium chloride flush  3 mL Intravenous Q12H   Continuous Infusions:  sodium chloride     PRN Meds: sodium chloride, naphazoline-glycerin, sodium chloride, sodium chloride flush   Vital Signs    Vitals:   08/13/22 0800 08/13/22 1200 08/13/22 1957 08/14/22 0343  BP: 132/68 (!) 152/93 114/72 (!) 154/83  Pulse: 79 66 68 67  Resp: 18 16    Temp: 98 F (36.7 C) (!) 97.5 F (36.4 C) 97.9 F (36.6 C) 97.9 F (36.6 C)  TempSrc: Oral Oral Oral Oral  SpO2: 99% 98% 97% 99%  Weight:      Height:       No intake or output data in the 24 hours ending 08/14/22 0721 Filed Weights   08/12/22 1642  Weight: 70.4 kg    Physical Exam    GEN- NAD, A&O x 3. Normal affect.  Lungs- CTAB, Normal effort.  Heart- Regular rate and rhythm. No M/G/R GI- Soft, NT, ND Extremities- No clubbing, cyanosis, or edema Skin- no rash or lesion  Labs    CBC No results for input(s): "WBC", "NEUTROABS", "HGB", "HCT", "MCV", "PLT" in the last 72 hours. Basic Metabolic Panel Recent Labs    08/13/22 0330 08/14/22 0352  NA 135 131*  K 3.9 4.4  CL 97* 97*  CO2 29 27  GLUCOSE 85 87  BUN 20 23  CREATININE 0.79 0.88  CALCIUM 8.6* 8.7*  MG 2.1 2.1    Telemetry    NSR 60-70s  (personally reviewed)  Patient Profile     Michelle Mosley is a 89 y.o. female with a past medical history significant for persistent atrial fibrillation.  They were admitted for tikosyn load.   Assessment & Plan    Persistent atrial fibrillation Pt remains in NSR on Tikosyn 250 mcg BID  Continue Eliquis Creatinine, ser  0.88 (01/31 0352) Magnesium  2.1 (01/31 0352) Potassium4.4 (01/31 0352) No electrolyte supplementation needed  Plan for home Thursday if QTc remains stable.   For questions or updates, please contact [place] Please consult [email or web address] for contact info under Cardiology/STEMI.  Signed, Shirley Friar, PA-C  08/14/2022, 7:21 AM   EP attending  Patient seen and examined.  Agree with the findings as noted above.  She continues to do well with initiation of dofetilide.  Her QT is acceptable.  Her electrolytes are okay.  Telemetry demonstrates sinus rhythm.  She will continue dofetilide and undergo telemetry and recurrent twelve-lead EKGs.  She will be discharged tomorrow if she maintains sinus rhythm and her QT interval remains acceptable.  Cristopher Peru, MD

## 2022-08-14 NOTE — Progress Notes (Signed)
Pharmacy: Dofetilide (Tikosyn) - Follow Up Assessment and Electrolyte Replacement  Pharmacy consulted to assist in monitoring and replacing electrolytes in this 89 y.o. female admitted on 08/12/2022 undergoing dofetilide initiation. First dofetilide dose: 08/12/22  Labs:    Component Value Date/Time   K 4.4 08/14/2022 0352   MG 2.1 08/14/2022 0352     Plan: Potassium: K >/= 4: No additional supplementation needed  Magnesium: Mg > 2: No additional supplementation needed   Arrie Senate, PharmD, BCPS, [facility] Clinical Pharmacist [phone] Please check AMION for all [facility] Pharmacy numbers 08/14/2022

## 2022-08-14 NOTE — Care Management (Signed)
08-14-22 1748 Patient is agreeable to initial Tikosyn Rx fill via [facility]. RX refills can be escribed to [facility].

## 2022-08-14 NOTE — Progress Notes (Signed)
Post Tikosyn Qtc 436, Pt is SB HR 57 by EKG

## 2022-08-15 ENCOUNTER — Other Ambulatory Visit (HOSPITAL_COMMUNITY): Payer: Self-pay

## 2022-08-15 ENCOUNTER — Encounter (HOSPITAL_COMMUNITY): Payer: Self-pay | Admitting: Internal Medicine

## 2022-08-15 DIAGNOSIS — I484 Atypical atrial flutter: Secondary | ICD-10-CM | POA: Diagnosis not present

## 2022-08-15 LAB — BASIC METABOLIC PANEL
Anion gap: 9 (ref 5–15)
BUN: 22 mg/dL (ref 8–23)
CO2: 25 mmol/L (ref 22–32)
Calcium: 8.4 mg/dL — ABNORMAL LOW (ref 8.9–10.3)
Chloride: 96 mmol/L — ABNORMAL LOW (ref 98–111)
Creatinine, Ser: 0.95 mg/dL (ref 0.44–1.00)
GFR, Estimated: 58 mL/min — ABNORMAL LOW (ref >60)
Glucose, Bld: 86 mg/dL (ref 70–99)
Potassium: 4.7 mmol/L (ref 3.5–5.1)
Sodium: 130 mmol/L — ABNORMAL LOW (ref 135–145)

## 2022-08-15 LAB — MAGNESIUM: Magnesium: 2 mg/dL (ref 1.7–2.4)

## 2022-08-15 MED ORDER — ORAL CARE MOUTH RINSE: 15.0000 mL | OROMUCOSAL | Status: DC | PRN

## 2022-08-15 MED ORDER — MAGNESIUM SULFATE 2 GM/50ML IV SOLN
2.0000 g | Freq: Once | INTRAVENOUS | Status: AC
  Administered 2022-08-15: 2 g via INTRAVENOUS
  Filled 2022-08-15: qty 50

## 2022-08-15 MED ORDER — DOFETILIDE 250 MCG PO CAPS
250.0000 ug | ORAL_CAPSULE | Freq: Two times a day (BID) | ORAL | 6 refills | Status: DC
  Filled 2022-08-15: qty 60, 30d supply, fill #0

## 2022-08-15 NOTE — Progress Notes (Addendum)
EKG from yesterday evening 08/14/2022 reviewed     Shows remains in NSR with stable QTc at 440 ms.  Continue  Tikosyn 250 mcg BID.   Potassium4.7 (02/01 5834) Magnesium  2.0 (02/01 0204) Creatinine, ser  0.95 (02/01 0204)  Plan for home this afternoon if QTc remains stable.   Shirley Friar, PA-C  08/15/2022 7:10 AM   EP Attending  Patient seen and examined. She continues to do well with her initiation of dofetilide. She is maintaining NSR and QTC is around 440. Her electrolytes are stable. She will  be discharged later today with usual followup.   [name] Lorenna Lurry,MD

## 2022-08-15 NOTE — Care Management Important Message (Signed)
Important Message  Patient Details  Name: Michelle Mosley MRN: [id number] Date of Birth: [birth year]   Medicare Important Message Given:  Yes     Shelda Altes 08/15/2022, 11:37 AM

## 2022-08-15 NOTE — Progress Notes (Signed)
Pharmacy: Dofetilide (Tikosyn) - Follow Up Assessment and Electrolyte Replacement  Pharmacy consulted to assist in monitoring and replacing electrolytes in this 89 y.o. female admitted on 08/12/2022 undergoing dofetilide initiation. First dofetilide dose: 08/12/22  Labs:    Component Value Date/Time   K 4.7 08/15/2022 0204   MG 2.0 08/15/2022 0204     Plan: Potassium: K >/= 4: No additional supplementation needed  Magnesium: Mg 1.8-2: Give Mg 2 gm IV x1    Michelle Mosley, PharmD, [place], [facility] Clinical Pharmacist [phone] Please check AMION for all [place] numbers 08/15/2022

## 2022-08-15 NOTE — Discharge Summary (Addendum)
ELECTROPHYSIOLOGY PROCEDURE DISCHARGE SUMMARY    Patient ID: Michelle Mosley,  MRN: [id number], DOB/AGE: [birth year] 89 y.o.  Admit date: 08/12/2022 Discharge date: 08/15/2022  Primary Care Physician: Lajean Manes, MD  Primary Cardiologist: None  Electrophysiologist: Dr. Lovena Le   Primary Discharge Diagnosis:  1.  Persistent atrial fibrillation status post Tikosyn loading this admission  No Known Allergies   Procedures This Admission:  1.  Tikosyn loading   Brief HPI: Michelle Mosley is a 89 y.o. female with a past medical history as noted above.  They were referred to EP for treatment options of atrial fibrillation.  Risks, benefits, and alternatives to Tikosyn were reviewed with the patient who wished to proceed with admission for loading.  Hospital Course:  The patient was admitted and Tikosyn was initiated.  Renal function and electrolytes were followed during the hospitalization.  Their QTc remained stable. The patient presented in sinus rhythm and did not require cardioversion. The patients QTc remained stable. They were monitored on telemetry up to discharge. On the day of discharge, they were examined by Dr. Lovena Le  who considered them stable for discharge to home.  Follow-up has been arranged with the Atrial Fibrillation clinic in approximately 1 week.   Physical Exam: Vitals:   08/14/22 0845 08/14/22 1959 08/15/22 0349 08/15/22 0803  BP: 126/71 (!) 109/49 (!) 143/71 (!) 170/84  Pulse:  70 65 63  Resp:  '16 17 18  '$ Temp:  98 F (36.7 C) 97.6 F (36.4 C) 97.8 F (36.6 C)  TempSrc:  Oral Oral Oral  SpO2:  99% 96% 96%  Weight:      Height:        GEN- NAD, A&O x 3. Normal affect.  Lungs- CTAB, Normal effort.  Heart- Regular rate and rhythm. No M/G/R GI- Soft, NT, ND Extremities- No clubbing, cyanosis, or edema Skin- no rash or lesion  Labs:   Lab Results  Component Value Date   WBC 7.6 06/05/2022   HGB 13.0 06/05/2022   HCT 39.8 06/05/2022   MCV 96.6  06/05/2022   PLT 215 06/05/2022    Recent Labs  Lab 08/15/22 0204  NA 130*  K 4.7  CL 96*  CO2 25  BUN 22  CREATININE 0.95  CALCIUM 8.4*  GLUCOSE 86    Discharge Medications:  Allergies as of 08/15/2022   No Known Allergies      Medication List     TAKE these medications    busPIRone 5 MG tablet Commonly known as: BUSPAR Take 5 mg by mouth 2 (two) times daily.   carvedilol 25 MG tablet Commonly known as: COREG Take 25 mg by mouth 2 (two) times daily with a meal.   [employer] Sodium Chloride 5 % ophthalmic solution Generic drug: sodium chloride Place 1 drop into the right eye every 4 (four) hours as needed for eye irritation.   diltiazem 120 MG tablet Commonly known as: CARDIZEM Take 120 mg by mouth every morning.   dofetilide 250 MCG capsule Commonly known as: TIKOSYN Take 1 capsule (250 mcg total) by mouth 2 (two) times daily.   Eliquis 5 MG Tabs tablet Generic drug: apixaban Take 5 mg by mouth 2 (two) times daily.   estradiol 0.1 MG/GM vaginal cream Commonly known as: ESTRACE Place 1 Applicatorful vaginally 3 (three) times a week. 1 application on Monday Wednesday and Fridays   furosemide 20 MG tablet Commonly known as: LASIX Take 20 mg by mouth 2 (two) times daily.  metroNIDAZOLE 0.75 % cream Commonly known as: METROCREAM Apply 1 Application topically daily as needed (For face rash).   polyethylene glycol 17 g packet Commonly known as: MIRALAX / GLYCOLAX Take 17 g by mouth 2 (two) times daily.   tetrahydrozoline 0.05 % ophthalmic solution Place 1 drop into both eyes daily as needed (for dry eyes).   valsartan 320 MG tablet Commonly known as: DIOVAN Take 320 mg by mouth daily.   Vitamin D 50 MCG (2000 UT) Caps Take 1 capsule by mouth daily.        Disposition:    Follow-up Information     [facility] Follow up.   Specialty: Cardiology Why: on 2/8 at 230 pm for post hospital follow  up Contact information: [address] [id number] [place] [phone]                Duration of Discharge Encounter: Greater than 30 minutes including physician time.  Jacalyn Lefevre, PA-C  08/15/2022 11:03 AM  EP Attending  Patient seen and examined. Agree with above. The patient is doing well after initiation of dofetilide. Her QT is good and she is maintaining NSR. Continue current meds. Usual followup.  [name] Maris Abascal,MD

## 2022-08-20 DIAGNOSIS — D485 Neoplasm of uncertain behavior of skin: Secondary | ICD-10-CM | POA: Diagnosis not present

## 2022-08-20 DIAGNOSIS — Z08 Encounter for follow-up examination after completed treatment for malignant neoplasm: Secondary | ICD-10-CM | POA: Diagnosis not present

## 2022-08-20 DIAGNOSIS — R58 Hemorrhage, not elsewhere classified: Secondary | ICD-10-CM | POA: Diagnosis not present

## 2022-08-20 DIAGNOSIS — L814 Other melanin hyperpigmentation: Secondary | ICD-10-CM | POA: Diagnosis not present

## 2022-08-20 DIAGNOSIS — D225 Melanocytic nevi of trunk: Secondary | ICD-10-CM | POA: Diagnosis not present

## 2022-08-20 DIAGNOSIS — Z85828 Personal history of other malignant neoplasm of skin: Secondary | ICD-10-CM | POA: Diagnosis not present

## 2022-08-20 DIAGNOSIS — D2239 Melanocytic nevi of other parts of face: Secondary | ICD-10-CM | POA: Diagnosis not present

## 2022-08-20 DIAGNOSIS — L820 Inflamed seborrheic keratosis: Secondary | ICD-10-CM | POA: Diagnosis not present

## 2022-08-20 DIAGNOSIS — L570 Actinic keratosis: Secondary | ICD-10-CM | POA: Diagnosis not present

## 2022-08-20 DIAGNOSIS — L821 Other seborrheic keratosis: Secondary | ICD-10-CM | POA: Diagnosis not present

## 2022-08-20 DIAGNOSIS — L538 Other specified erythematous conditions: Secondary | ICD-10-CM | POA: Diagnosis not present

## 2022-08-22 ENCOUNTER — Ambulatory Visit (HOSPITAL_COMMUNITY)
Admission: RE | Admit: 2022-08-22 | Discharge: 2022-08-22 | Disposition: A | Discharge status: Home / Self Care | Payer: Medicare Other | Source: Ambulatory Visit | Attending: Physician Assistant | Admitting: Physician Assistant

## 2022-08-22 ENCOUNTER — Encounter (HOSPITAL_COMMUNITY): Payer: Self-pay | Admitting: *Deleted

## 2022-08-22 VITALS — BP 128/70 | HR 82 | Ht 66.0 in | Wt 153.8 lb

## 2022-08-22 DIAGNOSIS — Z79899 Other long term (current) drug therapy: Secondary | ICD-10-CM | POA: Insufficient documentation

## 2022-08-22 DIAGNOSIS — I10 Essential (primary) hypertension: Secondary | ICD-10-CM | POA: Diagnosis not present

## 2022-08-22 DIAGNOSIS — E7800 Pure hypercholesterolemia, unspecified: Secondary | ICD-10-CM | POA: Diagnosis not present

## 2022-08-22 DIAGNOSIS — I484 Atypical atrial flutter: Secondary | ICD-10-CM | POA: Insufficient documentation

## 2022-08-22 DIAGNOSIS — M170 Bilateral primary osteoarthritis of knee: Secondary | ICD-10-CM | POA: Diagnosis not present

## 2022-08-22 DIAGNOSIS — D6869 Other thrombophilia: Secondary | ICD-10-CM | POA: Insufficient documentation

## 2022-08-22 DIAGNOSIS — I480 Paroxysmal atrial fibrillation: Secondary | ICD-10-CM | POA: Diagnosis not present

## 2022-08-22 DIAGNOSIS — Z86718 Personal history of other venous thrombosis and embolism: Secondary | ICD-10-CM | POA: Diagnosis not present

## 2022-08-22 DIAGNOSIS — Z7901 Long term (current) use of anticoagulants: Secondary | ICD-10-CM | POA: Diagnosis not present

## 2022-08-22 DIAGNOSIS — K219 Gastro-esophageal reflux disease without esophagitis: Secondary | ICD-10-CM | POA: Diagnosis not present

## 2022-08-22 LAB — BASIC METABOLIC PANEL
Anion gap: 9 (ref 5–15)
BUN: 22 mg/dL (ref 8–23)
CO2: 30 mmol/L (ref 22–32)
Calcium: 8.6 mg/dL — ABNORMAL LOW (ref 8.9–10.3)
Chloride: 91 mmol/L — ABNORMAL LOW (ref 98–111)
Creatinine, Ser: 0.81 mg/dL (ref 0.44–1.00)
GFR, Estimated: >60 mL/min (ref >60)
Glucose, Bld: 95 mg/dL (ref 70–99)
Potassium: 4 mmol/L (ref 3.5–5.1)
Sodium: 130 mmol/L — ABNORMAL LOW (ref 135–145)

## 2022-08-22 LAB — MAGNESIUM: Magnesium: 2.2 mg/dL (ref 1.7–2.4)

## 2022-08-22 MED ORDER — DOFETILIDE 250 MCG PO CAPS: 250.0000 ug | ORAL_CAPSULE | Freq: Two times a day (BID) | ORAL | 6 refills | Status: DC

## 2022-08-22 NOTE — Progress Notes (Signed)
Primary Care Physician: Lajean Manes, MD Referring Physician: ED f/u Primary EP: Dr Jalene Mullet Michelle Mosley is a 89 y.o. female with a h/o HTN, DVT rt leg, LEE, osteoarthritis of both knees, newly dx paroxsymal afib that is in the Afib clinic for f/u ED visit. She had seen her PCP for tachycardia and LEE. Labs drawn showed a BNP of 500 and a troponin of 20 so she was called at home and told to go to the ED. She was started on eliquis at PCP as EKG showed atrial flutter.she was already on daily lasix but it was increased in the ED for suspicion  of mild CHF. She  did not fit the picture of ACS.   She was asked to f/u in the Afib clinic. Today, she is in rate controlled atrial flutter. She states she lost  6-7 lbs with increase of lasix to 40 mg a day and her LEE is much improved. Her rt leg usually swells more that the left for arthritis of the knee. She does not describe any dyspnea. She is wearing a 2 week zio patch that was placed at the PCP office that will come off 12/6. She has a previously scheduled appointment pending with Dr. Beckie Salts 06/27/22. She is compliant with eliquis. She was already on carvedilol 25 mg bid and diltiazem 120 mg daily.   Follow up in the AF clinic 08/12/22. Patient presents for dofetilide admission today. She is in SR today. She denies any missed doses of anticoagulation.   Follow up in the AF clinic 08/22/22. Patient is s/p dofetilide loading 1/29-08/15/22. Patient reports that she has done well since her hospital stay. She does not feel much different but she was in SR on admission. No bleeding issues on anticoagulation.   Today, she denies symptoms of palpitations, chest pain, shortness of breath, orthopnea, PND, lower extremity edema, dizziness, presyncope, syncope, or neurologic sequela. The patient is tolerating medications without difficulties and is otherwise without complaint today.   Past Medical History:  Diagnosis Date   Arthritis    DVT (deep venous  thrombosis) ([place])    Hypertension    Past Surgical History:  Procedure Laterality Date   broken neck  [place]   excision of breast cyst Left    TOTAL KNEE ARTHROPLASTY Left 04/01/2019   Procedure: TOTAL KNEE ARTHROPLASTY;  Surgeon: [name], MD;  Location: [facility] ORS;  Service: Orthopedics;  Laterality: Left;  70 mins    Current Outpatient Medications  Medication Sig Dispense Refill   busPIRone (BUSPAR) 5 MG tablet Take 5 mg by mouth 2 (two) times daily.     carvedilol (COREG) 25 MG tablet Take 25 mg by mouth 2 (two) times daily with a meal.     Cholecalciferol (VITAMIN D) 2000 units CAPS Take 1 capsule by mouth daily.     diltiazem (CARDIZEM) 120 MG tablet Take 120 mg by mouth every morning.     dofetilide (TIKOSYN) 250 MCG capsule Take 1 capsule (250 mcg total) by mouth 2 (two) times daily. 60 capsule 6   ELIQUIS 5 MG TABS tablet Take 5 mg by mouth 2 (two) times daily.     estradiol (ESTRACE) 0.1 MG/GM vaginal cream Place 1 Applicatorful vaginally 3 (three) times a week. 1 application on Monday Wednesday and Fridays     furosemide (LASIX) 20 MG tablet Take 20 mg by mouth 2 (two) times daily.     metroNIDAZOLE (METROCREAM)  0.75 % cream Apply 1 Application topically daily as needed (For face rash).     polyethylene glycol (MIRALAX / GLYCOLAX) 17 g packet Take 17 g by mouth 2 (two) times daily. 28 packet 0   sodium chloride ([employer]S SODIUM CHLORIDE) 5 % ophthalmic solution Place 1 drop into the right eye every 4 (four) hours as needed for eye irritation.     tetrahydrozoline 0.05 % ophthalmic solution Place 1 drop into both eyes daily as needed (for dry eyes).     valsartan (DIOVAN) 320 MG tablet Take 320 mg by mouth daily.     No current facility-administered medications for this encounter.    No Known Allergies  Social History   Socioeconomic History   Marital status: Widowed    Spouse name: Not on file   Number of children: Not on file   Years of  education: Not on file   Highest education level: Not on file  Occupational History   Not on file  Tobacco Use   Smoking status: Never   Smokeless tobacco: Never   Tobacco comments:    Never smoke 06/11/22  Vaping Use   Vaping Use: Never used  Substance and Sexual Activity   Alcohol use: No   Drug use: Never   Sexual activity: Not on file  Other Topics Concern   Not on file  Social History Narrative   Not on file   Social Determinants of Health   Financial Resource Strain: Not on file  Food Insecurity: No Food Insecurity (08/15/2022)   Hunger Vital Sign    Worried About Running Out of Food in the Last Year: Never true    [place] in the Last Year: Never true  Transportation Needs: No Transportation Needs (08/15/2022)   PRAPARE - Hydrologist (Medical): No    Lack of Transportation (Non-Medical): No  Physical Activity: Not on file  Stress: Not on file  Social Connections: Not on file  Intimate Partner Violence: Not At Risk (08/15/2022)   Humiliation, Afraid, Rape, and Kick questionnaire    Fear of Current or Ex-Partner: No    Emotionally Abused: No    Physically Abused: No    Sexually Abused: No    Family History  Problem Relation Age of Onset   Colon cancer Mother     ROS- All systems are reviewed and negative except as per the HPI above  Physical Exam: Vitals:   08/22/22 1427  Weight: 69.8 kg  Height: '5\' 6"'$  (1.676 m)   Wt Readings from Last 3 Encounters:  08/22/22 69.8 kg  08/12/22 70.4 kg  08/12/22 70.5 kg    Labs: Lab Results  Component Value Date   NA 130 (L) 08/15/2022   K 4.7 08/15/2022   CL 96 (L) 08/15/2022   CO2 25 08/15/2022   GLUCOSE 86 08/15/2022   BUN 22 08/15/2022   CREATININE 0.95 08/15/2022   CALCIUM 8.4 (L) 08/15/2022   MG 2.0 08/15/2022   Lab Results  Component Value Date   INR 1.0 04/16/2022   No results found for: "CHOL", "HDL", "LDLCALC", "TRIG"   GEN- The patient is a well appearing  elderly female, alert and oriented x 3 today.   HEENT-head normocephalic, atraumatic, sclera clear, conjunctiva pink, hearing intact, trachea midline. Lungs- Clear to ausculation bilaterally, normal work of breathing Heart- Regular rate and rhythm, no murmurs, rubs or gallops  GI- soft, NT, ND, + BS Extremities- no clubbing, cyanosis, or edema MS-  no significant deformity or atrophy Skin- no rash or lesion Psych- euthymic mood, full affect Neuro- strength and sensation are intact   EKG- SR, 1st degree AV block, PAC Vent. rate 82 BPM PR interval 242 ms QRS duration 88 ms QT/QTcB 386/450 ms   Echo-  04/04/19-1. Left ventricular ejection fraction, by visual estimation, is 55 to 60%. The left ventricle has normal function. Normal left ventricular size. There is mildly increased left ventricular hypertrophy.   2. Left ventricular diastolic Doppler parameters are consistent with  impaired relaxation pattern of LV diastolic filling.   3. Global right ventricle has normal systolic function.The right  ventricular size is normal. No increase in right ventricular wall  thickness.   4. Left atrial size was normal.   5. Right atrial size was normal.   6. Moderate aortic valve annular calcification.   7. The mitral valve is grossly normal. Trace mitral valve regurgitation.   8. The tricuspid valve is abnormal. Tricuspid valve regurgitation  moderate-severe.   9. The tricuspid valve is thickened and appears to incompletely coapt.  10. The aortic valve is tricuspid Aortic valve regurgitation was not  visualized by color flow Doppler.  11. The pulmonic valve was grossly normal. Pulmonic valve regurgitation is  trivial by color flow Doppler.  12. Normal pulmonary artery systolic pressure.  13. The tricuspid regurgitant velocity is 2.52 m/s, and with an assumed  right atrial pressure of 3 mmHg, the estimated right ventricular systolic  pressure is normal at 28.4 mmHg.  14. The inferior vena cava  is normal in size with greater than 50%  respiratory variability, suggesting right atrial pressure of 3 mmHg.    CHA2DS2-VASc Score = 5  The patient's score is based upon: CHF History: 1 HTN History: 1 Diabetes History: 0 Stroke History: 0 Vascular Disease History: 0 Age Score: 2 Gender Score: 1       ASSESSMENT AND PLAN: 1. Atypical atrial flutter The patient's CHA2DS2-VASc score is 5, indicating a 7.2% annual risk of stroke.   S/p dofetilide admission 1/29-08/15/22 Patient appears to be maintaining SR. Continue dofetilide 250 mcg BID. QT stable. Continue Eliquis 5 mg BID Check bmet/mag today. Continue carvedilol 25 mg BID Continue diltiazem 120 mg daily  2. Secondary Hypercoagulable State (ICD10:  D68.69) The patient is at significant risk for stroke/thromboembolism based upon her CHA2DS2-VASc Score of 5.  Continue Apixaban (Eliquis).   3. HTN  Stable, no changes today.   Follow up in the AF clinic in one month.    [facility] [address] [place], [place] [address] [phone]

## 2022-09-04 DIAGNOSIS — M1711 Unilateral primary osteoarthritis, right knee: Secondary | ICD-10-CM | POA: Diagnosis not present

## 2022-09-04 DIAGNOSIS — N390 Urinary tract infection, site not specified: Secondary | ICD-10-CM | POA: Diagnosis not present

## 2022-09-12 ENCOUNTER — Other Ambulatory Visit (HOSPITAL_COMMUNITY): Payer: Self-pay | Admitting: *Deleted

## 2022-09-12 MED ORDER — DOFETILIDE 250 MCG PO CAPS: 250.0000 ug | ORAL_CAPSULE | Freq: Two times a day (BID) | ORAL | 6 refills | Status: DC

## 2022-09-25 ENCOUNTER — Encounter (HOSPITAL_COMMUNITY): Payer: Self-pay | Admitting: Physician Assistant

## 2022-09-25 ENCOUNTER — Ambulatory Visit (HOSPITAL_COMMUNITY)
Admission: RE | Admit: 2022-09-25 | Discharge: 2022-09-25 | Disposition: A | Discharge status: Home / Self Care | Payer: Medicare Other | Source: Ambulatory Visit | Attending: Physician Assistant | Admitting: Physician Assistant

## 2022-09-25 VITALS — BP 130/70 | HR 63 | Ht 66.0 in | Wt 154.2 lb

## 2022-09-25 DIAGNOSIS — I509 Heart failure, unspecified: Secondary | ICD-10-CM | POA: Insufficient documentation

## 2022-09-25 DIAGNOSIS — D6869 Other thrombophilia: Secondary | ICD-10-CM

## 2022-09-25 DIAGNOSIS — I110 Hypertensive heart disease with heart failure: Secondary | ICD-10-CM | POA: Diagnosis not present

## 2022-09-25 DIAGNOSIS — Z7901 Long term (current) use of anticoagulants: Secondary | ICD-10-CM | POA: Diagnosis not present

## 2022-09-25 DIAGNOSIS — I484 Atypical atrial flutter: Secondary | ICD-10-CM | POA: Diagnosis not present

## 2022-09-25 DIAGNOSIS — Z5181 Encounter for therapeutic drug level monitoring: Secondary | ICD-10-CM

## 2022-09-25 DIAGNOSIS — I480 Paroxysmal atrial fibrillation: Secondary | ICD-10-CM | POA: Diagnosis not present

## 2022-09-25 DIAGNOSIS — Z79899 Other long term (current) drug therapy: Secondary | ICD-10-CM

## 2022-09-25 LAB — BASIC METABOLIC PANEL
Anion gap: 11 (ref 5–15)
BUN: 20 mg/dL (ref 8–23)
CO2: 24 mmol/L (ref 22–32)
Calcium: 8.6 mg/dL — ABNORMAL LOW (ref 8.9–10.3)
Chloride: 96 mmol/L — ABNORMAL LOW (ref 98–111)
Creatinine, Ser: 0.86 mg/dL (ref 0.44–1.00)
GFR, Estimated: >60 mL/min (ref >60)
Glucose, Bld: 81 mg/dL (ref 70–99)
Potassium: 3.8 mmol/L (ref 3.5–5.1)
Sodium: 131 mmol/L — ABNORMAL LOW (ref 135–145)

## 2022-09-25 LAB — MAGNESIUM: Magnesium: 1.9 mg/dL (ref 1.7–2.4)

## 2022-09-25 NOTE — Progress Notes (Signed)
Primary Care Physician: Lajean Manes, MD Referring Physician: ED f/u Primary EP: Dr Jalene Mullet Michelle Mosley is a 89 y.o. female with a h/o HTN, DVT rt leg, LEE, osteoarthritis of both knees, newly dx paroxsymal afib that is in the Afib clinic for f/u ED visit. She had seen her PCP for tachycardia and LEE. Labs drawn showed a BNP of 500 and a troponin of 20 so she was called at home and told to go to the ED. She was started on eliquis at PCP as EKG showed atrial flutter.she was already on daily lasix but it was increased in the ED for suspicion  of mild CHF. She  did not fit the picture of ACS.   She was asked to f/u in the Afib clinic. Today, she is in rate controlled atrial flutter. She states she lost  6-7 lbs with increase of lasix to 40 mg a day and her LEE is much improved. Her rt leg usually swells more that the left for arthritis of the knee. She does not describe any dyspnea. She is wearing a 2 week zio patch that was placed at the PCP office that will come off 12/6. She has a previously scheduled appointment pending with Dr. Beckie Salts 06/27/22. She is compliant with eliquis. She was already on carvedilol 25 mg bid and diltiazem 120 mg daily.   Follow up in the AF clinic 08/12/22. Patient presents for dofetilide admission today. She is in SR today. She denies any missed doses of anticoagulation.   Follow up in the AF clinic 08/22/22. Patient is s/p dofetilide loading 1/29-08/15/22. Patient reports that she has done well since her hospital stay. She does not feel much different but she was in SR on admission. No bleeding issues on anticoagulation.   Follow up in the AF clinic 09/25/22. Patient reports that she has done well since her last visit. She is not aware of any interim episodes of afib. No bleeding issues on anticoagulation.   Today, she denies symptoms of palpitations, chest pain, shortness of breath, orthopnea, PND, lower extremity edema, dizziness, presyncope, syncope, or  neurologic sequela. The patient is tolerating medications without difficulties and is otherwise without complaint today.   Past Medical History:  Diagnosis Date   Arthritis    DVT (deep venous thrombosis) ([place])    Hypertension    Past Surgical History:  Procedure Laterality Date   broken neck  [place]   excision of breast cyst Left    TOTAL KNEE ARTHROPLASTY Left 04/01/2019   Procedure: TOTAL KNEE ARTHROPLASTY;  Surgeon: [name], MD;  Location: [facility];  Service: Orthopedics;  Laterality: Left;  70 mins    Current Outpatient Medications  Medication Sig Dispense Refill   busPIRone (BUSPAR) 5 MG tablet Take 5 mg by mouth 2 (two) times daily.     carvedilol (COREG) 25 MG tablet Take 25 mg by mouth 2 (two) times daily with a meal.     Cholecalciferol (VITAMIN D) 2000 units CAPS Take 1 capsule by mouth daily.     diltiazem (CARDIZEM) 120 MG tablet Take 120 mg by mouth every morning.     dofetilide (TIKOSYN) 250 MCG capsule Take 1 capsule (250 mcg total) by mouth 2 (two) times daily. 60 capsule 6   ELIQUIS 5 MG TABS tablet Take 5 mg by mouth 2 (two) times daily.     estradiol (ESTRACE) 0.1 MG/GM vaginal cream Place 1 Applicatorful vaginally 3 (  three) times a week. 1 application on Monday Wednesday and Fridays     furosemide (LASIX) 20 MG tablet Take 20 mg by mouth 2 (two) times daily.     metroNIDAZOLE (METROCREAM) 0.75 % cream Apply 1 Application topically daily as needed (For face rash).     polyethylene glycol (MIRALAX / GLYCOLAX) 17 g packet Take 17 g by mouth 2 (two) times daily. 28 packet 0   sodium chloride ([employer]S SODIUM CHLORIDE) 5 % ophthalmic solution Place 1 drop into the right eye every 4 (four) hours as needed for eye irritation.     tetrahydrozoline 0.05 % ophthalmic solution Place 1 drop into both eyes daily as needed (for dry eyes).     valsartan (DIOVAN) 320 MG tablet Take 320 mg by mouth daily.     No current facility-administered medications  for this encounter.    No Known Allergies  Social History   Socioeconomic History   Marital status: Widowed    Spouse name: Not on file   Number of children: Not on file   Years of education: Not on file   Highest education level: Not on file  Occupational History   Not on file  Tobacco Use   Smoking status: Never   Smokeless tobacco: Never   Tobacco comments:    Never smoke 06/11/22  Vaping Use   Vaping Use: Never used  Substance and Sexual Activity   Alcohol use: No   Drug use: Never   Sexual activity: Not on file  Other Topics Concern   Not on file  Social History Narrative   Not on file   Social Determinants of Health   Financial Resource Strain: Not on file  Food Insecurity: No Food Insecurity (08/15/2022)   Hunger Vital Sign    Worried About Running Out of Food in the Last Year: Never true    [place] in the Last Year: Never true  Transportation Needs: No Transportation Needs (08/15/2022)   PRAPARE - Hydrologist (Medical): No    Lack of Transportation (Non-Medical): No  Physical Activity: Not on file  Stress: Not on file  Social Connections: Not on file  Intimate Partner Violence: Not At Risk (08/15/2022)   Humiliation, Afraid, Rape, and Kick questionnaire    Fear of Current or Ex-Partner: No    Emotionally Abused: No    Physically Abused: No    Sexually Abused: No    Family History  Problem Relation Age of Onset   Colon cancer Mother     ROS- All systems are reviewed and negative except as per the HPI above  Physical Exam: Vitals:   09/25/22 1418  BP: 130/70  Pulse: 63  Weight: 69.9 kg  Height: '5\' 6"'$  (1.676 m)    Wt Readings from Last 3 Encounters:  09/25/22 69.9 kg  08/22/22 69.8 kg  08/12/22 70.4 kg    Labs: Lab Results  Component Value Date   NA 130 (L) 08/22/2022   K 4.0 08/22/2022   CL 91 (L) 08/22/2022   CO2 30 08/22/2022   GLUCOSE 95 08/22/2022   BUN 22 08/22/2022   CREATININE 0.81  08/22/2022   CALCIUM 8.6 (L) 08/22/2022   MG 2.2 08/22/2022   Lab Results  Component Value Date   INR 1.0 04/16/2022   No results found for: "CHOL", "HDL", "LDLCALC", "TRIG"   GEN- The patient is a well appearing elderly female, alert and oriented x 3 today.   HEENT-head normocephalic,  atraumatic, sclera clear, conjunctiva pink, hearing intact, trachea midline. Lungs- Clear to ausculation bilaterally, normal work of breathing Heart- Regular rate and rhythm, no murmurs, rubs or gallops  GI- soft, NT, ND, + BS Extremities- no clubbing, cyanosis, or edema MS- no significant deformity or atrophy Skin- no rash or lesion Psych- euthymic mood, full affect Neuro- strength and sensation are intact    EKG- SR, 1st degree AV block, PAC Vent. rate 63 BPM PR interval 208 ms QRS duration 86 ms QT/QTcB 406/415 ms   Echo- 04/04/19-1. Left ventricular ejection fraction, by visual estimation, is 55 to 60%. The left ventricle has normal function. Normal left ventricular size. There is mildly increased left ventricular hypertrophy.   2. Left ventricular diastolic Doppler parameters are consistent with  impaired relaxation pattern of LV diastolic filling.   3. Global right ventricle has normal systolic function.The right  ventricular size is normal. No increase in right ventricular wall  thickness.   4. Left atrial size was normal.   5. Right atrial size was normal.   6. Moderate aortic valve annular calcification.   7. The mitral valve is grossly normal. Trace mitral valve regurgitation.   8. The tricuspid valve is abnormal. Tricuspid valve regurgitation  moderate-severe.   9. The tricuspid valve is thickened and appears to incompletely coapt.  10. The aortic valve is tricuspid Aortic valve regurgitation was not  visualized by color flow Doppler.  11. The pulmonic valve was grossly normal. Pulmonic valve regurgitation is trivial by color flow Doppler.  12. Normal pulmonary artery systolic  pressure.  13. The tricuspid regurgitant velocity is 2.52 m/s, and with an assumed right atrial pressure of 3 mmHg, the estimated right ventricular systolic pressure is normal at 28.4 mmHg.  14. The inferior vena cava is normal in size with greater than 50%  respiratory variability, suggesting right atrial pressure of 3 mmHg.    CHA2DS2-VASc Score = 5  The patient's score is based upon: CHF History: 1 HTN History: 1 Diabetes History: 0 Stroke History: 0 Vascular Disease History: 0 Age Score: 2 Gender Score: 1       ASSESSMENT AND PLAN: 1. Atypical atrial flutter The patient's CHA2DS2-VASc score is 5, indicating a 7.2% annual risk of stroke.   S/p dofetilide admission 1/29-08/15/22 Patient appears to be maintaining SR.  Continue dofetilide 250 mcg BID. QT stable. Continue Eliquis 5 mg BID Check bmet/mag today. Continue carvedilol 25 mg BID Continue diltiazem 120 mg daily  2. Secondary Hypercoagulable State (ICD10:  D68.69) The patient is at significant risk for stroke/thromboembolism based upon her CHA2DS2-VASc Score of 5.  Continue Apixaban (Eliquis).   3. HTN  Stable, no changes today.   Follow up with Dr Lovena Le in 3 months. AF clinic in 6 months for dofetilide monitoring.    [facility] [address] [place], [place] [address] [phone]

## 2022-09-27 ENCOUNTER — Other Ambulatory Visit (HOSPITAL_COMMUNITY): Payer: Self-pay | Admitting: *Deleted

## 2022-09-27 MED ORDER — POTASSIUM CHLORIDE ER 10 MEQ PO TBCR: 10.0000 meq | EXTENDED_RELEASE_TABLET | Freq: Every day | ORAL | 3 refills | Status: DC

## 2022-10-15 ENCOUNTER — Ambulatory Visit (HOSPITAL_COMMUNITY)
Admission: RE | Admit: 2022-10-15 | Discharge: 2022-10-15 | Disposition: A | Discharge status: Home / Self Care | Payer: Medicare Other | Source: Ambulatory Visit | Attending: Physician Assistant | Admitting: Physician Assistant

## 2022-10-15 DIAGNOSIS — Z79899 Other long term (current) drug therapy: Secondary | ICD-10-CM | POA: Diagnosis not present

## 2022-10-15 DIAGNOSIS — Z5181 Encounter for therapeutic drug level monitoring: Secondary | ICD-10-CM | POA: Diagnosis not present

## 2022-10-15 DIAGNOSIS — I480 Paroxysmal atrial fibrillation: Secondary | ICD-10-CM | POA: Diagnosis not present

## 2022-10-15 LAB — BASIC METABOLIC PANEL
Anion gap: 7 (ref 5–15)
BUN: 17 mg/dL (ref 8–23)
CO2: 29 mmol/L (ref 22–32)
Calcium: 8.7 mg/dL — ABNORMAL LOW (ref 8.9–10.3)
Chloride: 98 mmol/L (ref 98–111)
Creatinine, Ser: 0.74 mg/dL (ref 0.44–1.00)
GFR, Estimated: >60 mL/min (ref >60)
Glucose, Bld: 84 mg/dL (ref 70–99)
Potassium: 4.3 mmol/L (ref 3.5–5.1)
Sodium: 134 mmol/L — ABNORMAL LOW (ref 135–145)

## 2022-10-28 DIAGNOSIS — N390 Urinary tract infection, site not specified: Secondary | ICD-10-CM | POA: Diagnosis not present

## 2022-11-11 DIAGNOSIS — R3914 Feeling of incomplete bladder emptying: Secondary | ICD-10-CM | POA: Diagnosis not present

## 2022-11-11 DIAGNOSIS — N3020 Other chronic cystitis without hematuria: Secondary | ICD-10-CM | POA: Diagnosis not present

## 2022-11-13 DIAGNOSIS — I10 Essential (primary) hypertension: Secondary | ICD-10-CM | POA: Diagnosis not present

## 2022-11-13 DIAGNOSIS — K5901 Slow transit constipation: Secondary | ICD-10-CM | POA: Diagnosis not present

## 2022-11-13 DIAGNOSIS — I700 Atherosclerosis of aorta: Secondary | ICD-10-CM | POA: Diagnosis not present

## 2022-11-13 DIAGNOSIS — Z79899 Other long term (current) drug therapy: Secondary | ICD-10-CM | POA: Diagnosis not present

## 2022-11-13 DIAGNOSIS — I480 Paroxysmal atrial fibrillation: Secondary | ICD-10-CM | POA: Diagnosis not present

## 2022-11-13 DIAGNOSIS — Z0000 Encounter for general adult medical examination without abnormal findings: Secondary | ICD-10-CM | POA: Diagnosis not present

## 2022-11-13 DIAGNOSIS — E7800 Pure hypercholesterolemia, unspecified: Secondary | ICD-10-CM | POA: Diagnosis not present

## 2022-11-27 DIAGNOSIS — I10 Essential (primary) hypertension: Secondary | ICD-10-CM | POA: Diagnosis not present

## 2022-11-27 DIAGNOSIS — K219 Gastro-esophageal reflux disease without esophagitis: Secondary | ICD-10-CM | POA: Diagnosis not present

## 2022-11-27 DIAGNOSIS — I4891 Unspecified atrial fibrillation: Secondary | ICD-10-CM | POA: Diagnosis not present

## 2022-11-27 DIAGNOSIS — E7800 Pure hypercholesterolemia, unspecified: Secondary | ICD-10-CM | POA: Diagnosis not present

## 2022-12-04 DIAGNOSIS — M1711 Unilateral primary osteoarthritis, right knee: Secondary | ICD-10-CM | POA: Diagnosis not present

## 2022-12-12 DIAGNOSIS — Z947 Corneal transplant status: Secondary | ICD-10-CM | POA: Diagnosis not present

## 2022-12-12 DIAGNOSIS — Z961 Presence of intraocular lens: Secondary | ICD-10-CM | POA: Diagnosis not present

## 2022-12-12 DIAGNOSIS — H59011 Keratopathy (bullous aphakic) following cataract surgery, right eye: Secondary | ICD-10-CM | POA: Diagnosis not present

## 2022-12-12 DIAGNOSIS — H04123 Dry eye syndrome of bilateral lacrimal glands: Secondary | ICD-10-CM | POA: Diagnosis not present

## 2022-12-27 DIAGNOSIS — R350 Frequency of micturition: Secondary | ICD-10-CM | POA: Diagnosis not present

## 2022-12-27 DIAGNOSIS — R300 Dysuria: Secondary | ICD-10-CM | POA: Diagnosis not present

## 2022-12-27 DIAGNOSIS — N3090 Cystitis, unspecified without hematuria: Secondary | ICD-10-CM | POA: Diagnosis not present

## 2023-01-02 DIAGNOSIS — I4891 Unspecified atrial fibrillation: Secondary | ICD-10-CM | POA: Diagnosis not present

## 2023-01-02 DIAGNOSIS — I10 Essential (primary) hypertension: Secondary | ICD-10-CM | POA: Diagnosis not present

## 2023-01-02 DIAGNOSIS — E7800 Pure hypercholesterolemia, unspecified: Secondary | ICD-10-CM | POA: Diagnosis not present

## 2023-01-02 DIAGNOSIS — K219 Gastro-esophageal reflux disease without esophagitis: Secondary | ICD-10-CM | POA: Diagnosis not present

## 2023-01-06 ENCOUNTER — Encounter: Payer: Self-pay | Admitting: Internal Medicine

## 2023-01-06 ENCOUNTER — Ambulatory Visit: Payer: Medicare Other | Attending: Internal Medicine | Admitting: Internal Medicine

## 2023-01-06 VITALS — BP 162/82 | HR 63 | Ht 66.0 in | Wt 153.6 lb

## 2023-01-06 DIAGNOSIS — I480 Paroxysmal atrial fibrillation: Secondary | ICD-10-CM | POA: Diagnosis not present

## 2023-01-06 NOTE — Patient Instructions (Signed)
Medication Instructions:  Your physician recommends that you continue on your current medications as directed. Please refer to the Current Medication list given to you today. *If you need a refill on your cardiac medications before your next appointment, please call your pharmacy   Follow-Up: At [facility], you and your health needs are our priority.  As part of our continuing mission to provide you with exceptional heart care, we have created designated Provider Care Teams.  These Care Teams include your primary Cardiologist (physician) and Advanced Practice Providers (APPs -  Physician Assistants and Nurse Practitioners) who all work together to provide you with the care you need, when you need it.  We recommend signing up for the patient portal called "MyChart".  Sign up information is provided on this After Visit Summary.  MyChart is used to connect with patients for Virtual Visits (Telemedicine).  Patients are able to view lab/test results, encounter notes, upcoming appointments, etc.  Non-urgent messages can be sent to your provider as well.   To learn more about what you can do with MyChart, go to [email or web address].    Your next appointment:   1 year(s)  Provider:   Gregg Taylor, MD   

## 2023-01-06 NOTE — Progress Notes (Signed)
HPI Michelle Mosley is referred by Michelle Mosley for evaluation of atrial flutter. She is a pleasant 89 yo woman with a h/o HTN, osteo arthritis, and atrial flutter with a mostly controlled VR. She was seen in the afib clinic a month ago and is referred for additional evaluation. She has worn a 14 day zio monitor which demonstrated atrial flutter with a CVR and RVR. Her flutter is atypical.  No Known Allergies   Current Outpatient Medications  Medication Sig Dispense Refill   busPIRone (BUSPAR) 5 MG tablet Take 5 mg by mouth 2 (two) times daily.     carvedilol (COREG) 25 MG tablet Take 25 mg by mouth 2 (two) times daily with a meal.     Cholecalciferol (VITAMIN D) 2000 units CAPS Take 1 capsule by mouth daily.     diltiazem (CARDIZEM) 120 MG tablet Take 120 mg by mouth every morning.     dofetilide (TIKOSYN) 250 MCG capsule Take 1 capsule (250 mcg total) by mouth 2 (two) times daily. 60 capsule 6   ELIQUIS 5 MG TABS tablet Take 5 mg by mouth 2 (two) times daily.     estradiol (ESTRACE) 0.1 MG/GM vaginal cream Place 1 Applicatorful vaginally 3 (three) times a week. 1 application on Monday Wednesday and Fridays     furosemide (LASIX) 20 MG tablet Take 20 mg by mouth 2 (two) times daily.     metroNIDAZOLE (METROCREAM) 0.75 % cream Apply 1 Application topically daily as needed (For face rash).     polyethylene glycol (MIRALAX / GLYCOLAX) 17 g packet Take 17 g by mouth 2 (two) times daily. 28 packet 0   sodium chloride (CVS SODIUM CHLORIDE) 5 % ophthalmic solution Place 1 drop into the right eye every 4 (four) hours as needed for eye irritation.     tetrahydrozoline 0.05 % ophthalmic solution Place 1 drop into both eyes daily as needed (for dry eyes).     valsartan (DIOVAN) 320 MG tablet Take 320 mg by mouth daily.     potassium chloride (KLOR-CON) 10 MEQ tablet Take 1 tablet (10 mEq total) by mouth daily. 90 tablet 3   No current facility-administered medications for this visit.     Past  Medical History:  Diagnosis Date   Arthritis    DVT (deep venous thrombosis) ([facility])    Hypertension     ROS:   All systems reviewed and negative except as noted in the HPI.   Past Surgical History:  Procedure Laterality Date   broken neck  1976   CERVICAL DISC SURGERY  1975   excision of breast cyst Left    TOTAL KNEE ARTHROPLASTY Left 04/01/2019   Procedure: TOTAL KNEE ARTHROPLASTY;  Surgeon: Durene Romans, MD;  Location: [facility];  Service: Orthopedics;  Laterality: Left;  70 mins     Family History  Problem Relation Age of Onset   Colon cancer Mother      Social History   Socioeconomic History   Marital status: Widowed    Spouse name: Not on file   Number of children: Not on file   Years of education: Not on file   Highest education level: Not on file  Occupational History   Not on file  Tobacco Use   Smoking status: Never   Smokeless tobacco: Never   Tobacco comments:    Never smoke 06/11/22  Vaping Use   Vaping Use: Never used  Substance and Sexual Activity   Alcohol use: No  Drug use: Never   Sexual activity: Not on file  Other Topics Concern   Not on file  Social History Narrative   Not on file   Social Determinants of Health   Financial Resource Strain: Not on file  Food Insecurity: No Food Insecurity (08/15/2022)   Hunger Vital Sign    Worried About Running Out of Food in the Last Year: Never true    Ran Out of Food in the Last Year: Never true  Transportation Needs: No Transportation Needs (08/15/2022)   PRAPARE - Administrator, Civil Service (Medical): No    Lack of Transportation (Non-Medical): No  Physical Activity: Not on file  Stress: Not on file  Social Connections: Not on file  Intimate Partner Violence: Not At Risk (08/15/2022)   Humiliation, Afraid, Rape, and Kick questionnaire    Fear of Current or Ex-Partner: No    Emotionally Abused: No    Physically Abused: No    Sexually Abused: No     BP (!) 162/82   Pulse 63    Ht 5\' 6"  (1.676 m)   Wt 153 lb 9.6 oz (69.7 kg)   SpO2 96%   BMI 24.79 kg/m   Physical Exam:  Well appearing NAD HEENT: Unremarkable Neck:  No JVD, no thyromegally Lymphatics:  No adenopathy Back:  No CVA tenderness Lungs:  Clear HEART:  Regular rate rhythm, no murmurs, no rubs, no clicks Abd:  soft, positive bowel sounds, no organomegally, no rebound, no guarding Ext:  2 plus pulses, no edema, no cyanosis, no clubbing Skin:  No rashes no nodules Neuro:  CN II through XII intact, motor grossly intact  EKG - nsr   Assess/Plan:  Atypical atrial flutter - she is maintaining NSR on dofetilide. Continue. HTN - her bp is well controlled. No change in meds.   Michelle Mosley Michelle Ostenson,MD

## 2023-01-19 DIAGNOSIS — R350 Frequency of micturition: Secondary | ICD-10-CM | POA: Diagnosis not present

## 2023-01-20 DIAGNOSIS — K219 Gastro-esophageal reflux disease without esophagitis: Secondary | ICD-10-CM | POA: Diagnosis not present

## 2023-01-20 DIAGNOSIS — E7800 Pure hypercholesterolemia, unspecified: Secondary | ICD-10-CM | POA: Diagnosis not present

## 2023-01-20 DIAGNOSIS — I10 Essential (primary) hypertension: Secondary | ICD-10-CM | POA: Diagnosis not present

## 2023-01-20 DIAGNOSIS — I480 Paroxysmal atrial fibrillation: Secondary | ICD-10-CM | POA: Diagnosis not present

## 2023-02-07 DIAGNOSIS — L218 Other seborrheic dermatitis: Secondary | ICD-10-CM | POA: Diagnosis not present

## 2023-02-07 DIAGNOSIS — L448 Other specified papulosquamous disorders: Secondary | ICD-10-CM | POA: Diagnosis not present

## 2023-02-07 DIAGNOSIS — L718 Other rosacea: Secondary | ICD-10-CM | POA: Diagnosis not present

## 2023-02-07 DIAGNOSIS — L820 Inflamed seborrheic keratosis: Secondary | ICD-10-CM | POA: Diagnosis not present

## 2023-02-07 DIAGNOSIS — L538 Other specified erythematous conditions: Secondary | ICD-10-CM | POA: Diagnosis not present

## 2023-02-07 DIAGNOSIS — L298 Other pruritus: Secondary | ICD-10-CM | POA: Diagnosis not present

## 2023-03-06 DIAGNOSIS — C4442 Squamous cell carcinoma of skin of scalp and neck: Secondary | ICD-10-CM | POA: Diagnosis not present

## 2023-03-06 DIAGNOSIS — L448 Other specified papulosquamous disorders: Secondary | ICD-10-CM | POA: Diagnosis not present

## 2023-03-06 DIAGNOSIS — L218 Other seborrheic dermatitis: Secondary | ICD-10-CM | POA: Diagnosis not present

## 2023-03-06 DIAGNOSIS — D485 Neoplasm of uncertain behavior of skin: Secondary | ICD-10-CM | POA: Diagnosis not present

## 2023-03-11 ENCOUNTER — Other Ambulatory Visit (HOSPITAL_COMMUNITY): Payer: Self-pay | Admitting: Physician Assistant

## 2023-03-14 ENCOUNTER — Other Ambulatory Visit (HOSPITAL_COMMUNITY): Payer: Self-pay | Admitting: Physician Assistant

## 2023-03-26 DIAGNOSIS — M1711 Unilateral primary osteoarthritis, right knee: Secondary | ICD-10-CM | POA: Diagnosis not present

## 2023-04-08 ENCOUNTER — Ambulatory Visit (HOSPITAL_COMMUNITY)
Admission: RE | Admit: 2023-04-08 | Discharge: 2023-04-08 | Disposition: A | Discharge status: Home / Self Care | Payer: Medicare Other | Source: Ambulatory Visit | Attending: Physician Assistant | Admitting: Physician Assistant

## 2023-04-08 ENCOUNTER — Encounter (HOSPITAL_COMMUNITY): Payer: Self-pay | Admitting: Physician Assistant

## 2023-04-08 VITALS — BP 142/72 | HR 64 | Ht 66.0 in | Wt 152.6 lb

## 2023-04-08 DIAGNOSIS — I10 Essential (primary) hypertension: Secondary | ICD-10-CM | POA: Insufficient documentation

## 2023-04-08 DIAGNOSIS — I484 Atypical atrial flutter: Secondary | ICD-10-CM | POA: Insufficient documentation

## 2023-04-08 DIAGNOSIS — Z5181 Encounter for therapeutic drug level monitoring: Secondary | ICD-10-CM | POA: Insufficient documentation

## 2023-04-08 DIAGNOSIS — D6869 Other thrombophilia: Secondary | ICD-10-CM | POA: Insufficient documentation

## 2023-04-08 DIAGNOSIS — Z86718 Personal history of other venous thrombosis and embolism: Secondary | ICD-10-CM | POA: Diagnosis not present

## 2023-04-08 DIAGNOSIS — I480 Paroxysmal atrial fibrillation: Secondary | ICD-10-CM | POA: Diagnosis not present

## 2023-04-08 DIAGNOSIS — Z7901 Long term (current) use of anticoagulants: Secondary | ICD-10-CM | POA: Diagnosis not present

## 2023-04-08 DIAGNOSIS — Z79899 Other long term (current) drug therapy: Secondary | ICD-10-CM | POA: Diagnosis not present

## 2023-04-08 LAB — BASIC METABOLIC PANEL
Anion gap: 7 (ref 5–15)
BUN: 15 mg/dL (ref 8–23)
CO2: 26 mmol/L (ref 22–32)
Calcium: 8.3 mg/dL — ABNORMAL LOW (ref 8.9–10.3)
Chloride: 97 mmol/L — ABNORMAL LOW (ref 98–111)
Creatinine, Ser: 0.73 mg/dL (ref 0.44–1.00)
GFR, Estimated: >60 mL/min (ref >60)
Glucose, Bld: 97 mg/dL (ref 70–99)
Potassium: 4.6 mmol/L (ref 3.5–5.1)
Sodium: 130 mmol/L — ABNORMAL LOW (ref 135–145)

## 2023-04-08 LAB — MAGNESIUM: Magnesium: 1.9 mg/dL (ref 1.7–2.4)

## 2023-04-08 NOTE — Progress Notes (Signed)
Primary Care Physician: Merlene Laughter, MD (Inactive) Referring Physician: ED f/u Primary EP: Dr Lyndle Herrlich Michelle Mosley is a 89 y.o. female with a h/o HTN, DVT rt leg, LEE, osteoarthritis of both knees, newly dx paroxsymal afib that is in the Afib clinic for f/u ED visit. She had seen her PCP for tachycardia and LEE. Labs drawn showed a BNP of 500 and a troponin of 20 so she was called at home and told to go to the ED. She was started on eliquis at PCP as EKG showed atrial flutter.she was already on daily lasix but it was increased in the ED for suspicion  of mild CHF. She  did not fit the picture of ACS.   She was asked to f/u in the Afib clinic. Today, she is in rate controlled atrial flutter. She states she lost  6-7 lbs with increase of lasix to 40 mg a day and her LEE is much improved. Her rt leg usually swells more that the left for arthritis of the knee. She does not describe any dyspnea. She is wearing a 2 week zio patch that was placed at the PCP office that will come off 12/6. She has a previously scheduled appointment pending with Dr. Rosette Reveal 06/27/22. She is compliant with eliquis. She was already on carvedilol 25 mg bid and diltiazem 120 mg daily.   Follow up in the AF clinic 08/12/22. Patient presents for dofetilide admission today. She is in SR today. She denies any missed doses of anticoagulation.   Follow up in the AF clinic 08/22/22. Patient is s/p dofetilide loading 1/29-08/15/22. Patient reports that she has done well since her hospital stay. She does not feel much different but she was in SR on admission. No bleeding issues on anticoagulation.   Follow up in the AF clinic 09/25/22. Patient reports that she has done well since her last visit. She is not aware of any interim episodes of afib. No bleeding issues on anticoagulation.   Follow up in the AF clinic 04/08/23. Patient reports that she has felt well since her last visit. She remains in SR, no symptoms of afib. No bleeding  issues on anticoagulation.   Today, she denies symptoms of palpitations, chest pain, shortness of breath, orthopnea, PND, lower extremity edema, dizziness, presyncope, syncope, or neurologic sequela. The patient is tolerating medications without difficulties and is otherwise without complaint today.   Past Medical History:  Diagnosis Date   Arthritis    DVT (deep venous thrombosis) ([facility])    Hypertension    Past Surgical History:  Procedure Laterality Date   broken neck  1976   CERVICAL DISC SURGERY  1975   excision of breast cyst Left    TOTAL KNEE ARTHROPLASTY Left 04/01/2019   Procedure: TOTAL KNEE ARTHROPLASTY;  Surgeon: Durene Romans, MD;  Location: [facility];  Service: Orthopedics;  Laterality: Left;  70 mins    Current Outpatient Medications  Medication Sig Dispense Refill   busPIRone (BUSPAR) 5 MG tablet Take 5 mg by mouth 2 (two) times daily.     carvedilol (COREG) 25 MG tablet Take 25 mg by mouth 2 (two) times daily with a meal.     Cholecalciferol (VITAMIN D) 2000 units CAPS Take 1 capsule by mouth daily.     diltiazem (CARDIZEM) 120 MG tablet Take 120 mg by mouth every morning.     dofetilide (TIKOSYN) 250 MCG capsule TAKE 1 CAPSULE BY MOUTH TWICE DAILY *REFILL REQUEST* 60 capsule 4  ELIQUIS 5 MG TABS tablet Take 5 mg by mouth 2 (two) times daily.     estradiol (ESTRACE) 0.1 MG/GM vaginal cream Place 1 Applicatorful vaginally 3 (three) times a week. 1 application on Monday Wednesday and Fridays     furosemide (LASIX) 20 MG tablet Take 20 mg by mouth 2 (two) times daily.     metroNIDAZOLE (METROCREAM) 0.75 % cream Apply 1 Application topically daily as needed (For face rash).     polyethylene glycol (MIRALAX / GLYCOLAX) 17 g packet Take 17 g by mouth 2 (two) times daily. 28 packet 0   sodium chloride (CVS SODIUM CHLORIDE) 5 % ophthalmic solution Place 1 drop into the right eye every 4 (four) hours as needed for eye irritation.     tetrahydrozoline 0.05 % ophthalmic solution  Place 1 drop into both eyes daily as needed (for dry eyes).     valsartan (DIOVAN) 320 MG tablet Take 320 mg by mouth daily.     potassium chloride (KLOR-CON) 10 MEQ tablet Take 1 tablet (10 mEq total) by mouth daily. 90 tablet 3   No current facility-administered medications for this encounter.    ROS- All systems are reviewed and negative except as per the HPI above  Physical Exam: Vitals:   04/08/23 1347  BP: (!) 142/72  Pulse: 64  Weight: 69.2 kg  Height: 5\' 6"  (1.676 m)    Wt Readings from Last 3 Encounters:  04/08/23 69.2 kg  01/06/23 69.7 kg  09/25/22 69.9 kg    GEN: Well nourished, well developed in no acute distress NECK: No JVD; No carotid bruits CARDIAC: Regular rate and rhythm, no murmurs, rubs, gallops RESPIRATORY:  Clear to auscultation without rales, wheezing or rhonchi  ABDOMEN: Soft, non-tender, non-distended EXTREMITIES:  No edema; No deformity     EKG today demonstrates SR, 1st degree AV block Vent. rate 64 BPM PR interval 202 ms QRS duration 76 ms QT/QTcB 400/412 ms   Echo- 04/04/19- 1. Left ventricular ejection fraction, by visual estimation, is 55 to 60%. The left ventricle has normal function. Normal left ventricular size. There is mildly increased left ventricular hypertrophy.   2. Left ventricular diastolic Doppler parameters are consistent with  impaired relaxation pattern of LV diastolic filling.   3. Global right ventricle has normal systolic function.The right  ventricular size is normal. No increase in right ventricular wall  thickness.   4. Left atrial size was normal.   5. Right atrial size was normal.   6. Moderate aortic valve annular calcification.   7. The mitral valve is grossly normal. Trace mitral valve regurgitation.   8. The tricuspid valve is abnormal. Tricuspid valve regurgitation  moderate-severe.   9. The tricuspid valve is thickened and appears to incompletely coapt.  10. The aortic valve is tricuspid Aortic valve  regurgitation was not  visualized by color flow Doppler.  11. The pulmonic valve was grossly normal. Pulmonic valve regurgitation is trivial by color flow Doppler.  12. Normal pulmonary artery systolic pressure.  13. The tricuspid regurgitant velocity is 2.52 m/s, and with an assumed right atrial pressure of 3 mmHg, the estimated right ventricular systolic pressure is normal at 28.4 mmHg.  14. The inferior vena cava is normal in size with greater than 50%  respiratory variability, suggesting right atrial pressure of 3 mmHg.    CHA2DS2-VASc Score = 5  The patient's score is based upon: CHF History: 1 HTN History: 1 Diabetes History: 0 Stroke History: 0 Vascular Disease History: 0 Age  Score: 2 Gender Score: 1       ASSESSMENT AND PLAN: Atypical atrial flutter The patient's CHA2DS2-VASc score is 5, indicating a 7.2% annual risk of stroke.   S/p dofetilide admission 1/29-08/15/22 Patient appears to be maintaining SR Continue dofetilide 250 mcg BID, QT stable Continue Eliquis 5 mg BID Check bmet/mag Continue carvedilol 25 mg BID Continue diltiazem 120 mg daily  Secondary Hypercoagulable State (ICD10:  D68.69) The patient is at significant risk for stroke/thromboembolism based upon her CHA2DS2-VASc Score of 5.  Continue Apixaban (Eliquis).   HTN Stable on current regimen   Follow up in the AF clinic in 6 months.    Jorja Loa PA-C [facility] [address] [place], [place] [address] [phone]

## 2023-05-01 DIAGNOSIS — L718 Other rosacea: Secondary | ICD-10-CM | POA: Diagnosis not present

## 2023-05-01 DIAGNOSIS — Z86007 Personal history of in-situ neoplasm of skin: Secondary | ICD-10-CM | POA: Diagnosis not present

## 2023-05-01 DIAGNOSIS — L928 Other granulomatous disorders of the skin and subcutaneous tissue: Secondary | ICD-10-CM | POA: Diagnosis not present

## 2023-05-01 DIAGNOSIS — Z08 Encounter for follow-up examination after completed treatment for malignant neoplasm: Secondary | ICD-10-CM | POA: Diagnosis not present

## 2023-05-13 DIAGNOSIS — R300 Dysuria: Secondary | ICD-10-CM | POA: Diagnosis not present

## 2023-05-13 DIAGNOSIS — I110 Hypertensive heart disease with heart failure: Secondary | ICD-10-CM | POA: Diagnosis not present

## 2023-05-13 DIAGNOSIS — M170 Bilateral primary osteoarthritis of knee: Secondary | ICD-10-CM | POA: Diagnosis not present

## 2023-05-13 DIAGNOSIS — I484 Atypical atrial flutter: Secondary | ICD-10-CM | POA: Diagnosis not present

## 2023-05-13 DIAGNOSIS — D6869 Other thrombophilia: Secondary | ICD-10-CM | POA: Diagnosis not present

## 2023-05-13 DIAGNOSIS — I872 Venous insufficiency (chronic) (peripheral): Secondary | ICD-10-CM | POA: Diagnosis not present

## 2023-05-13 DIAGNOSIS — I5032 Chronic diastolic (congestive) heart failure: Secondary | ICD-10-CM | POA: Diagnosis not present

## 2023-05-22 DIAGNOSIS — K5909 Other constipation: Secondary | ICD-10-CM | POA: Diagnosis not present

## 2023-05-22 DIAGNOSIS — R54 Age-related physical debility: Secondary | ICD-10-CM | POA: Diagnosis not present

## 2023-05-22 DIAGNOSIS — Z23 Encounter for immunization: Secondary | ICD-10-CM | POA: Diagnosis not present

## 2023-05-22 DIAGNOSIS — I5032 Chronic diastolic (congestive) heart failure: Secondary | ICD-10-CM | POA: Diagnosis not present

## 2023-05-28 DIAGNOSIS — Z86007 Personal history of in-situ neoplasm of skin: Secondary | ICD-10-CM | POA: Diagnosis not present

## 2023-05-28 DIAGNOSIS — Z08 Encounter for follow-up examination after completed treatment for malignant neoplasm: Secondary | ICD-10-CM | POA: Diagnosis not present

## 2023-05-28 DIAGNOSIS — L570 Actinic keratosis: Secondary | ICD-10-CM | POA: Diagnosis not present

## 2023-05-28 DIAGNOSIS — L928 Other granulomatous disorders of the skin and subcutaneous tissue: Secondary | ICD-10-CM | POA: Diagnosis not present

## 2023-06-25 ENCOUNTER — Emergency Department (HOSPITAL_COMMUNITY)
Admission: EM | Admit: 2023-06-25 | Discharge: 2023-06-25 | Disposition: A | Discharge status: Home / Self Care | Payer: Medicare Other | Attending: Emergency Medicine | Admitting: Emergency Medicine

## 2023-06-25 ENCOUNTER — Emergency Department (HOSPITAL_COMMUNITY): Payer: Medicare Other

## 2023-06-25 DIAGNOSIS — Z981 Arthrodesis status: Secondary | ICD-10-CM | POA: Diagnosis not present

## 2023-06-25 DIAGNOSIS — W19XXXA Unspecified fall, initial encounter: Secondary | ICD-10-CM | POA: Diagnosis not present

## 2023-06-25 DIAGNOSIS — M47816 Spondylosis without myelopathy or radiculopathy, lumbar region: Secondary | ICD-10-CM | POA: Diagnosis not present

## 2023-06-25 DIAGNOSIS — Z7901 Long term (current) use of anticoagulants: Secondary | ICD-10-CM | POA: Insufficient documentation

## 2023-06-25 DIAGNOSIS — M25559 Pain in unspecified hip: Secondary | ICD-10-CM | POA: Diagnosis not present

## 2023-06-25 DIAGNOSIS — S0081XA Abrasion of other part of head, initial encounter: Secondary | ICD-10-CM

## 2023-06-25 DIAGNOSIS — W100XXA Fall (on)(from) escalator, initial encounter: Secondary | ICD-10-CM | POA: Diagnosis not present

## 2023-06-25 DIAGNOSIS — R58 Hemorrhage, not elsewhere classified: Secondary | ICD-10-CM | POA: Diagnosis not present

## 2023-06-25 DIAGNOSIS — S0031XA Abrasion of nose, initial encounter: Secondary | ICD-10-CM | POA: Diagnosis not present

## 2023-06-25 DIAGNOSIS — Z79899 Other long term (current) drug therapy: Secondary | ICD-10-CM | POA: Diagnosis not present

## 2023-06-25 DIAGNOSIS — R6889 Other general symptoms and signs: Secondary | ICD-10-CM | POA: Diagnosis not present

## 2023-06-25 DIAGNOSIS — M1711 Unilateral primary osteoarthritis, right knee: Secondary | ICD-10-CM | POA: Diagnosis not present

## 2023-06-25 DIAGNOSIS — S60051A Contusion of right little finger without damage to nail, initial encounter: Secondary | ICD-10-CM | POA: Insufficient documentation

## 2023-06-25 DIAGNOSIS — S199XXA Unspecified injury of neck, initial encounter: Secondary | ICD-10-CM | POA: Diagnosis not present

## 2023-06-25 DIAGNOSIS — S0990XA Unspecified injury of head, initial encounter: Secondary | ICD-10-CM | POA: Diagnosis not present

## 2023-06-25 DIAGNOSIS — M8580 Other specified disorders of bone density and structure, unspecified site: Secondary | ICD-10-CM | POA: Diagnosis not present

## 2023-06-25 DIAGNOSIS — S0083XA Contusion of other part of head, initial encounter: Secondary | ICD-10-CM | POA: Insufficient documentation

## 2023-06-25 MED ORDER — LIDOCAINE-EPINEPHRINE-TETRACAINE (LET) TOPICAL GEL
3.0000 mL | Freq: Once | TOPICAL | Status: AC
Start: 2023-06-25 — End: 2023-06-25
  Administered 2023-06-25: 3 mL via TOPICAL
  Filled 2023-06-25: qty 3

## 2023-06-25 NOTE — ED Provider Notes (Signed)
[place] EMERGENCY DEPARTMENT AT [facility] Provider Note   CSN: [id number] Arrival date & time: 06/25/23  1219     History Chief Complaint  Patient presents with   Fall on thinners    Michelle Mosley is a 89 y.o. female.  Patient with past history significant for atrial flutter, atrial fibrillation, and anemia presents the emergency department with concerns of a fall.  Patient brought in as a level 2 trauma for mechanical fall on blood thinners.  Takes Eliquis.  Reportedly fell off of the escalator is at a [employer] store when she slipped.  Right forehead struck ground and patient was on the ground briefly.  EMS arrived on scene about 10 minutes later and had patient placed into a c-collar.  No significant cervical spine tenderness and endorses primary pain is to the right forehead and right hand along the fifth digit.  HPI     Home Medications Prior to Admission medications   Medication Sig Start Date End Date Taking? Authorizing Provider  busPIRone (BUSPAR) 5 MG tablet Take 5 mg by mouth 2 (two) times daily. 02/01/22   [provider]  carvedilol (COREG) 25 MG tablet Take 25 mg by mouth 2 (two) times daily with a meal.    [provider]  Cholecalciferol (VITAMIN D) 2000 units CAPS Take 1 capsule by mouth daily.    [provider]  diltiazem (CARDIZEM) 120 MG tablet Take 120 mg by mouth every morning. 07/12/19   [provider]  dofetilide (TIKOSYN) 250 MCG capsule TAKE 1 CAPSULE BY MOUTH TWICE DAILY *REFILL REQUEST* 03/14/23   Fenton, Clint R, PA  ELIQUIS 5 MG TABS tablet Take 5 mg by mouth 2 (two) times daily. 06/05/22   [provider]  estradiol (ESTRACE) 0.1 MG/GM vaginal cream Place 1 Applicatorful vaginally 3 (three) times a week. 1 application on Monday Wednesday and Fridays    [provider]  furosemide (LASIX) 20 MG tablet Take 20 mg by mouth 2 (two) times daily. 03/22/22   [provider]   metroNIDAZOLE (METROCREAM) 0.75 % cream Apply 1 Application topically daily as needed (For face rash).    [provider]  polyethylene glycol (MIRALAX / GLYCOLAX) 17 g packet Take 17 g by mouth 2 (two) times daily. 04/02/19   Lanney Gins, PA-C  potassium chloride (KLOR-CON) 10 MEQ tablet Take 1 tablet (10 mEq total) by mouth daily. 09/27/22 12/26/22  Fenton, Clint R, PA  sodium chloride ([employer] SODIUM CHLORIDE) 5 % ophthalmic solution Place 1 drop into the right eye every 4 (four) hours as needed for eye irritation.    [provider]  tetrahydrozoline 0.05 % ophthalmic solution Place 1 drop into both eyes daily as needed (for dry eyes).    [provider]  valsartan (DIOVAN) 320 MG tablet Take 320 mg by mouth daily. 02/01/22   [provider]      Allergies    Patient has no known allergies.    Review of Systems   Review of Systems  Musculoskeletal:        Hand pain  Skin:  Positive for wound.  All other systems reviewed and are negative.   Physical Exam Updated Vital Signs BP (!) 187/127   Pulse 72   Temp 98 F (36.7 C) (Oral)   Resp (!) 21   SpO2 96%  Physical Exam Vitals and nursing note reviewed.  Constitutional:      General: She is not in acute distress.  Appearance: She is well-developed.  HENT:     Head: Normocephalic and atraumatic.  Eyes:     Conjunctiva/sclera: Conjunctivae normal.  Cardiovascular:     Rate and Rhythm: Normal rate and regular rhythm.     Heart sounds: No murmur heard. Pulmonary:     Effort: Pulmonary effort is normal. No respiratory distress.     Breath sounds: Normal breath sounds.  Abdominal:     Palpations: Abdomen is soft.     Tenderness: There is no abdominal tenderness.  Musculoskeletal:        General: Swelling, tenderness and signs of injury present. No deformity. Normal range of motion.     Cervical back: Neck supple.     Comments: Swelling and bruising noted to the right 5th digit.   Skin:     General: Skin is warm and dry.     Capillary Refill: Capillary refill takes less than 2 seconds.          Comments: Contusion to the right forehead with some bleeding, but wound appear superficial.  Neurological:     General: No focal deficit present.     Mental Status: She is alert and oriented to person, place, and time. Mental status is at baseline.  Psychiatric:        Mood and Affect: Mood normal.     ED Results / Procedures / Treatments   Labs (all labs ordered are listed, but only abnormal results are displayed) Labs Reviewed - No data to display  EKG None  Radiology DG Pelvis Portable  Result Date: 06/25/2023 CLINICAL DATA:  Pain after fall EXAM: PORTABLE PELVIS 1 VIEWS COMPARISON:  None Available. FINDINGS: Osteopenia. Mild joint space loss of the hips. There is some sclerosis and osteophytes of the sacroiliac joints. Hyperostosis. More advanced degenerative changes of the visualized portions of the lumbar spine. No acute fracture or dislocation. However with this level of osteopenia subtle nondisplaced injury can be difficult to completely exclude and if needed additional cross-sectional imaging study as clinically appropriate for higher sensitivity. IMPRESSION: Osteopenia with degenerative changes. Electronically Signed   By: Karen Kays M.D.   On: 06/25/2023 13:28   CT Head Wo Contrast  Result Date: 06/25/2023 CLINICAL DATA:  Head trauma, minor (Age >= 65y); Facial trauma, blunt; Neck trauma (Age >= 65y) EXAM: CT HEAD WITHOUT CONTRAST CT MAXILLOFACIAL WITHOUT CONTRAST CT CERVICAL SPINE WITHOUT CONTRAST TECHNIQUE: Multidetector CT imaging of the head, cervical spine, and maxillofacial structures were performed using the standard protocol without intravenous contrast. Multiplanar CT image reconstructions of the cervical spine and maxillofacial structures were also generated. RADIATION DOSE REDUCTION: This exam was performed according to the departmental dose-optimization  program which includes automated exposure control, adjustment of the mA and/or kV according to patient size and/or use of iterative reconstruction technique. COMPARISON:  None Available. FINDINGS: CT HEAD FINDINGS Brain: No evidence of acute infarction, hemorrhage, hydrocephalus, extra-axial collection or mass lesion/mass effect. Vascular: No hyperdense vessel. Skull: No acute fracture. Other: Right forehead contusion. CT MAXILLOFACIAL FINDINGS Osseous: No fracture or mandibular dislocation. No destructive process. Orbits: Negative. No traumatic or inflammatory finding. Sinuses: Clear sinuses. Soft tissues: Right forehead contusion. CT CERVICAL SPINE FINDINGS Alignment: No substantial sagittal subluxation. Skull base and vertebrae: No evidence of acute fracture. Posterior fusion with cerclage wires at C4-C6. Soft tissues and spinal canal: No prevertebral fluid or swelling. No visible canal hematoma. Disc levels:  Moderate multilevel degenerative change. Upper chest: Visualized lung apices are clear. Other: Visualized lung apices are clear.  IMPRESSION: 1. No evidence of acute abnormality intracranially or in the cervical spine. 2. Right forehead contusion without fracture. Electronically Signed   By: Feliberto Harts M.D.   On: 06/25/2023 13:25   CT Cervical Spine Wo Contrast  Result Date: 06/25/2023 CLINICAL DATA:  Head trauma, minor (Age >= 65y); Facial trauma, blunt; Neck trauma (Age >= 65y) EXAM: CT HEAD WITHOUT CONTRAST CT MAXILLOFACIAL WITHOUT CONTRAST CT CERVICAL SPINE WITHOUT CONTRAST TECHNIQUE: Multidetector CT imaging of the head, cervical spine, and maxillofacial structures were performed using the standard protocol without intravenous contrast. Multiplanar CT image reconstructions of the cervical spine and maxillofacial structures were also generated. RADIATION DOSE REDUCTION: This exam was performed according to the departmental dose-optimization program which includes automated exposure control,  adjustment of the mA and/or kV according to patient size and/or use of iterative reconstruction technique. COMPARISON:  None Available. FINDINGS: CT HEAD FINDINGS Brain: No evidence of acute infarction, hemorrhage, hydrocephalus, extra-axial collection or mass lesion/mass effect. Vascular: No hyperdense vessel. Skull: No acute fracture. Other: Right forehead contusion. CT MAXILLOFACIAL FINDINGS Osseous: No fracture or mandibular dislocation. No destructive process. Orbits: Negative. No traumatic or inflammatory finding. Sinuses: Clear sinuses. Soft tissues: Right forehead contusion. CT CERVICAL SPINE FINDINGS Alignment: No substantial sagittal subluxation. Skull base and vertebrae: No evidence of acute fracture. Posterior fusion with cerclage wires at C4-C6. Soft tissues and spinal canal: No prevertebral fluid or swelling. No visible canal hematoma. Disc levels:  Moderate multilevel degenerative change. Upper chest: Visualized lung apices are clear. Other: Visualized lung apices are clear. IMPRESSION: 1. No evidence of acute abnormality intracranially or in the cervical spine. 2. Right forehead contusion without fracture. Electronically Signed   By: Feliberto Harts M.D.   On: 06/25/2023 13:25   CT Maxillofacial Wo Contrast  Result Date: 06/25/2023 CLINICAL DATA:  Head trauma, minor (Age >= 65y); Facial trauma, blunt; Neck trauma (Age >= 65y) EXAM: CT HEAD WITHOUT CONTRAST CT MAXILLOFACIAL WITHOUT CONTRAST CT CERVICAL SPINE WITHOUT CONTRAST TECHNIQUE: Multidetector CT imaging of the head, cervical spine, and maxillofacial structures were performed using the standard protocol without intravenous contrast. Multiplanar CT image reconstructions of the cervical spine and maxillofacial structures were also generated. RADIATION DOSE REDUCTION: This exam was performed according to the departmental dose-optimization program which includes automated exposure control, adjustment of the mA and/or kV according to patient  size and/or use of iterative reconstruction technique. COMPARISON:  None Available. FINDINGS: CT HEAD FINDINGS Brain: No evidence of acute infarction, hemorrhage, hydrocephalus, extra-axial collection or mass lesion/mass effect. Vascular: No hyperdense vessel. Skull: No acute fracture. Other: Right forehead contusion. CT MAXILLOFACIAL FINDINGS Osseous: No fracture or mandibular dislocation. No destructive process. Orbits: Negative. No traumatic or inflammatory finding. Sinuses: Clear sinuses. Soft tissues: Right forehead contusion. CT CERVICAL SPINE FINDINGS Alignment: No substantial sagittal subluxation. Skull base and vertebrae: No evidence of acute fracture. Posterior fusion with cerclage wires at C4-C6. Soft tissues and spinal canal: No prevertebral fluid or swelling. No visible canal hematoma. Disc levels:  Moderate multilevel degenerative change. Upper chest: Visualized lung apices are clear. Other: Visualized lung apices are clear. IMPRESSION: 1. No evidence of acute abnormality intracranially or in the cervical spine. 2. Right forehead contusion without fracture. Electronically Signed   By: Feliberto Harts M.D.   On: 06/25/2023 13:25    Procedures Procedures   Medications Ordered in ED Medications  lidocaine-EPINEPHrine-tetracaine (LET) topical gel (3 mLs Topical Given 06/25/23 1256)    ED Course/ Medical Decision Making/ A&P  Medical Decision Making Amount and/or Complexity of Data Reviewed Radiology: ordered.   This patient presents to the ED for concern of mechanical fall.  Differential diagnosis includes SAH, forehead laceration, skull fracture, metacarpal fracture   Imaging Studies ordered:  I ordered imaging studies including CT head, CT cervical spine, CT maxillofacial, x-ray pelvis I independently visualized and interpreted imaging which showed No evidence of acute abnormality intracranially or in the cervical spine. Right forehead contusion  without fracture.  I agree with the radiologist interpretation   Problem List / ED Course:  Patient with past history significant for atrial fibrillation, atrial flutter, anemia presents to the emergency department concerns of mechanical fall.  Patient was brought in by EMS after mechanical fall when she slipped trying off and escalator.  Suspect this was due to a wet surface.  Patient reports that she struck her right forehead against the ground or some other object and has a contusion to this area.  Abrasion noted to this area with some minimal bleeding.  Patient is on Eliquis for atrial fibrillation.  Denies syncope, headache, nausea, vomiting, or vision changes. CT imaging of head, cervical spine, and maxillofacial unremarkable with no findings of any acute head bleed or fracture. Xray of pelvis is unremarkable. Had ordered xray of the right pinky but at time of imaging, patient refused as she stated that she was not having significant pain at that time. Areas of abrasion on the right forehead and nasal bridge had LET gel applied for hemostasis and further evaluation. After LET gel applied and left for about 30 minutes, the wounds were evaluated and determined to not be amenable to suture repair or dermabond closure. The abrasions are highly superficial and will heal well on their own with gentle wound cleansing and monitoring for infection.  Informed patient's son of reassuring workup and plan for discharge,. Discussed management of wounds at home. Also advised close PCP follow up and strict return precautions to the ED if new or worsening symptoms were to arise. No acute or focal concerns at this time. Patient discharged home in stable condition.  Final Clinical Impression(s) / ED Diagnoses Final diagnoses:  Fall (on)(from) escalator, initial encounter  Contusion of forehead, initial encounter  Abrasion of forehead, initial encounter  Abrasion of nose, initial encounter    Rx / DC Orders ED  Discharge Orders     None         Salomon Mast 06/25/23 2248    Gerhard Munch, MD 06/26/23 [id number]

## 2023-06-25 NOTE — ED Notes (Signed)
Trauma Response Nurse Documentation  Michelle Mosley is a 89 y.o. female arriving to [facility] ED via EMS  On Eliquis (apixaban) daily. Trauma was activated as a Level 2 based on the following trauma criteria Elderly patients > 65 with head trauma on anti-coagulation (excluding ASA).  Patient cleared for CT by Dr. Jeraldine Loots. Pt transported to CT with trauma response nurse present to monitor. RN remained with the patient throughout their absence from the department for clinical observation. GCS 15.  History   Past Medical History:  Diagnosis Date   Arthritis    DVT (deep venous thrombosis) (HCC)    Hypertension      Past Surgical History:  Procedure Laterality Date   broken neck  1976   CERVICAL DISC SURGERY  1975   excision of breast cyst Left    TOTAL KNEE ARTHROPLASTY Left 04/01/2019   Procedure: TOTAL KNEE ARTHROPLASTY;  Surgeon: Durene Romans, MD;  Location: [facility] ORS;  Service: Orthopedics;  Laterality: Left;  70 mins     Initial Focused Assessment (If applicable, or please see trauma documentation): Patient A&Ox4, GCS 15, PERR 3 Airway intact, bilateral breath sounds Pulses 2+ C-collar in place  CT's Completed:   CT Head and CT C-Spine and Maxillofacial  Interventions:  PXR, hand XR CT Head/Cspine  Plan for disposition:  Discharge home   Bedside handoff with ED RN Genevie Cheshire.    Jill Side Kupono Marling  Trauma Response RN  Please call TRN at [phone] for further assistance.

## 2023-06-25 NOTE — Progress Notes (Signed)
Orthopedic Tech Progress Note Patient Details:  Michelle Mosley June 03, 1933 [id number]  Responded to level 2 trauma, not needed at this time  Patient ID: Michelle Mosley, female   DOB: 11/06/1932, 89 y.o.   MRN: [id number]  Michelle Mosley 06/25/2023, 12:31 PM

## 2023-06-25 NOTE — Discharge Instructions (Addendum)
You were seen today at [facility] Emergency Department for a fall. Your imaging studies were thankfully reassuring with no signs of any fractures, head bleeds, or other brain or facial abnormalities. The abrasions to your forehead and nose are superficial in nature and appear to be minimal enough to not require any sutures or glue. I would recommend following up with your primary care provider for further evaluation. If new symptoms develop such as confusion, weakness, severe headache, or any other concerning symptoms, return to the ER.

## 2023-06-25 NOTE — ED Triage Notes (Signed)
Pt arrives via [facility] for fall on thinners. Pt was getting off the escalator at [facility] and slipped. Pt has contusion to R forehead. Denies LOC. Anticoagulated on Eliquis.

## 2023-08-05 DIAGNOSIS — R8271 Bacteriuria: Secondary | ICD-10-CM | POA: Diagnosis not present

## 2023-08-05 DIAGNOSIS — R3121 Asymptomatic microscopic hematuria: Secondary | ICD-10-CM | POA: Diagnosis not present

## 2023-08-05 DIAGNOSIS — N3020 Other chronic cystitis without hematuria: Secondary | ICD-10-CM | POA: Diagnosis not present

## 2023-09-12 DIAGNOSIS — I10 Essential (primary) hypertension: Secondary | ICD-10-CM | POA: Diagnosis not present

## 2023-09-12 DIAGNOSIS — N3020 Other chronic cystitis without hematuria: Secondary | ICD-10-CM | POA: Diagnosis not present

## 2023-09-12 DIAGNOSIS — I4891 Unspecified atrial fibrillation: Secondary | ICD-10-CM | POA: Diagnosis not present

## 2023-09-12 DIAGNOSIS — I872 Venous insufficiency (chronic) (peripheral): Secondary | ICD-10-CM | POA: Diagnosis not present

## 2023-09-18 DIAGNOSIS — Z08 Encounter for follow-up examination after completed treatment for malignant neoplasm: Secondary | ICD-10-CM | POA: Diagnosis not present

## 2023-09-18 DIAGNOSIS — L821 Other seborrheic keratosis: Secondary | ICD-10-CM | POA: Diagnosis not present

## 2023-09-18 DIAGNOSIS — L820 Inflamed seborrheic keratosis: Secondary | ICD-10-CM | POA: Diagnosis not present

## 2023-09-18 DIAGNOSIS — L718 Other rosacea: Secondary | ICD-10-CM | POA: Diagnosis not present

## 2023-09-18 DIAGNOSIS — L928 Other granulomatous disorders of the skin and subcutaneous tissue: Secondary | ICD-10-CM | POA: Diagnosis not present

## 2023-09-18 DIAGNOSIS — L538 Other specified erythematous conditions: Secondary | ICD-10-CM | POA: Diagnosis not present

## 2023-09-18 DIAGNOSIS — Z86007 Personal history of in-situ neoplasm of skin: Secondary | ICD-10-CM | POA: Diagnosis not present

## 2023-09-18 DIAGNOSIS — D225 Melanocytic nevi of trunk: Secondary | ICD-10-CM | POA: Diagnosis not present

## 2023-09-18 DIAGNOSIS — L814 Other melanin hyperpigmentation: Secondary | ICD-10-CM | POA: Diagnosis not present

## 2023-09-27 ENCOUNTER — Other Ambulatory Visit (HOSPITAL_COMMUNITY): Payer: Self-pay | Admitting: Physician Assistant

## 2023-10-02 DIAGNOSIS — N390 Urinary tract infection, site not specified: Secondary | ICD-10-CM | POA: Diagnosis not present

## 2023-10-08 ENCOUNTER — Ambulatory Visit (HOSPITAL_COMMUNITY)
Admission: RE | Admit: 2023-10-08 | Discharge: 2023-10-08 | Disposition: A | Discharge status: Home / Self Care | Payer: Medicare Other | Source: Ambulatory Visit | Attending: Physician Assistant | Admitting: Physician Assistant

## 2023-10-08 VITALS — BP 170/80 | HR 66 | Ht 66.0 in | Wt 155.6 lb

## 2023-10-08 DIAGNOSIS — D6869 Other thrombophilia: Secondary | ICD-10-CM | POA: Diagnosis not present

## 2023-10-08 DIAGNOSIS — Z7901 Long term (current) use of anticoagulants: Secondary | ICD-10-CM | POA: Diagnosis not present

## 2023-10-08 DIAGNOSIS — Z86718 Personal history of other venous thrombosis and embolism: Secondary | ICD-10-CM | POA: Diagnosis not present

## 2023-10-08 DIAGNOSIS — M1711 Unilateral primary osteoarthritis, right knee: Secondary | ICD-10-CM | POA: Diagnosis not present

## 2023-10-08 DIAGNOSIS — I110 Hypertensive heart disease with heart failure: Secondary | ICD-10-CM | POA: Diagnosis not present

## 2023-10-08 DIAGNOSIS — I509 Heart failure, unspecified: Secondary | ICD-10-CM | POA: Diagnosis not present

## 2023-10-08 DIAGNOSIS — I484 Atypical atrial flutter: Secondary | ICD-10-CM | POA: Insufficient documentation

## 2023-10-08 DIAGNOSIS — Z79899 Other long term (current) drug therapy: Secondary | ICD-10-CM | POA: Insufficient documentation

## 2023-10-08 DIAGNOSIS — I10 Essential (primary) hypertension: Secondary | ICD-10-CM | POA: Diagnosis not present

## 2023-10-08 DIAGNOSIS — M170 Bilateral primary osteoarthritis of knee: Secondary | ICD-10-CM | POA: Insufficient documentation

## 2023-10-08 DIAGNOSIS — Z5181 Encounter for therapeutic drug level monitoring: Secondary | ICD-10-CM | POA: Diagnosis not present

## 2023-10-08 LAB — BASIC METABOLIC PANEL
Anion gap: 8 (ref 5–15)
BUN: 16 mg/dL (ref 8–23)
CO2: 26 mmol/L (ref 22–32)
Calcium: 8.9 mg/dL (ref 8.9–10.3)
Chloride: 97 mmol/L — ABNORMAL LOW (ref 98–111)
Creatinine, Ser: 0.76 mg/dL (ref 0.44–1.00)
GFR, Estimated: >60 mL/min (ref >60)
Glucose, Bld: 121 mg/dL — ABNORMAL HIGH (ref 70–99)
Potassium: 4.7 mmol/L (ref 3.5–5.1)
Sodium: 131 mmol/L — ABNORMAL LOW (ref 135–145)

## 2023-10-08 LAB — MAGNESIUM: Magnesium: 2.1 mg/dL (ref 1.7–2.4)

## 2023-10-08 LAB — CBC
HCT: 37.7 % (ref 36.0–46.0)
Hemoglobin: 12.2 g/dL (ref 12.0–15.0)
MCH: 30.7 pg (ref 26.0–34.0)
MCHC: 32.4 g/dL (ref 30.0–36.0)
MCV: 94.7 fL (ref 80.0–100.0)
Platelets: 195 10*3/uL (ref 150–400)
RBC: 3.98 MIL/uL (ref 3.87–5.11)
RDW: 12.9 % (ref 11.5–15.5)
WBC: 5.5 10*3/uL (ref 4.0–10.5)
nRBC: 0 % (ref 0.0–0.2)

## 2023-10-08 NOTE — Progress Notes (Signed)
 Primary Care Physician: Thana Ates, MD Referring Physician: ED f/u Primary EP: Dr Lyndle Herrlich Michelle Mosley is a 89 y.o. female with a h/o HTN, DVT rt leg, LEE, osteoarthritis of both knees, newly dx paroxsymal afib that is in the Afib clinic for f/u ED visit. She had seen her PCP for tachycardia and LEE. Labs drawn showed a BNP of 500 and a troponin of 20 so she was called at home and told to go to the ED. She was started on eliquis at PCP as EKG showed atrial flutter.she was already on daily lasix but it was increased in the ED for suspicion  of mild CHF. She  did not fit the picture of ACS.   She was asked to f/u in the Afib clinic. Today, she is in rate controlled atrial flutter. She states she lost  6-7 lbs with increase of lasix to 40 mg a day and her LEE is much improved. Her rt leg usually swells more that the left for arthritis of the knee. She does not describe any dyspnea. She is wearing a 2 week zio patch that was placed at the PCP office that will come off 12/6. She has a previously scheduled appointment pending with Dr. Rosette Reveal 06/27/22. She is compliant with eliquis. She was already on carvedilol 25 mg bid and diltiazem 120 mg daily.   Follow up in the AF clinic 08/12/22. Patient presents for dofetilide admission today. She is in SR today. She denies any missed doses of anticoagulation.   Follow up in the AF clinic 08/22/22. Patient is s/p dofetilide loading 1/29-08/15/22. Patient reports that she has done well since her hospital stay. She does not feel much different but she was in SR on admission. No bleeding issues on anticoagulation.   Follow up in the AF clinic 09/25/22. Patient reports that she has done well since her last visit. She is not aware of any interim episodes of afib. No bleeding issues on anticoagulation.   Follow up in the AF clinic 04/08/23. Patient reports that she has felt well since her last visit. She remains in SR, no symptoms of afib. No bleeding issues on  anticoagulation.   Follow up 10/08/23. Patient returns for follow up for atrial fibrillation and dofetilide monitoring. She remains in SR today. She did have some "fluttering" in her chest a few weeks ago in the setting of a UTI. The symptoms have resolved. She has noted labile BP readings at home ranging from 130-170 systolic. No bleeding issues on anticoagulation.   Today, he denies symptoms of palpitations, chest pain, shortness of breath, orthopnea, PND, lower extremity edema, dizziness, presyncope, syncope, snoring, daytime somnolence, bleeding, or neurologic sequela. The patient is tolerating medications without difficulties and is otherwise without complaint today.    Past Medical History:  Diagnosis Date   Arthritis    DVT (deep venous thrombosis) (HCC)    Hypertension     Current Outpatient Medications  Medication Sig Dispense Refill   busPIRone (BUSPAR) 5 MG tablet Take 5 mg by mouth 2 (two) times daily.     carvedilol (COREG) 25 MG tablet Take 25 mg by mouth 2 (two) times daily with a meal.     Cholecalciferol (VITAMIN D) 2000 units CAPS Take 1 capsule by mouth daily.     diltiazem (CARDIZEM) 120 MG tablet Take 120 mg by mouth every morning.     dofetilide (TIKOSYN) 250 MCG capsule TAKE 1 CAPSULE BY MOUTH TWICE DAILY *REFILL  REQUEST* 60 capsule 4   ELIQUIS 5 MG TABS tablet Take 5 mg by mouth 2 (two) times daily.     estradiol (ESTRACE) 0.1 MG/GM vaginal cream Place 1 Applicatorful vaginally 3 (three) times a week. 1 application on Monday Wednesday and Fridays     metroNIDAZOLE (METROCREAM) 0.75 % cream Apply 1 Application topically daily as needed (For face rash).     polyethylene glycol (MIRALAX / GLYCOLAX) 17 g packet Take 17 g by mouth 2 (two) times daily. 28 packet 0   Polyethylene Glycol 3350 (EQ CLEARLAX PO) She uses twice daily     potassium chloride (KLOR-CON) 10 MEQ tablet Take 1 tablet by mouth once daily 90 tablet 1   sodium chloride ([employer]S SODIUM CHLORIDE) 5 %  ophthalmic solution Place 1 drop into the right eye every 4 (four) hours as needed for eye irritation.     tetrahydrozoline 0.05 % ophthalmic solution Place 1 drop into both eyes daily as needed (for dry eyes).     valsartan (DIOVAN) 320 MG tablet Take 320 mg by mouth daily.     furosemide (LASIX) 20 MG tablet Take 20 mg by mouth 2 (two) times daily. (Patient not taking: Reported on 10/08/2023)     No current facility-administered medications for this encounter.    ROS- All systems are reviewed and negative except as per the HPI above  Physical Exam: Vitals:   10/08/23 1355  BP: (!) 170/80  Pulse: 66  Weight: 70.6 kg  Height: 5\' 6"  (1.676 m)     Wt Readings from Last 3 Encounters:  10/08/23 70.6 kg  04/08/23 69.2 kg  01/06/23 69.7 kg    GEN: Well nourished, well developed in no acute distress CARDIAC: Regular rate and rhythm, no murmurs, rubs, gallops RESPIRATORY:  Clear to auscultation without rales, wheezing or rhonchi  ABDOMEN: Soft, non-tender, non-distended EXTREMITIES:  trace bilateral lower extremity edema, right knee swollen    EKG today demonstrates SR, 1st degree AV block Vent. rate 66 BPM PR interval 230 ms QRS duration 84 ms QT/QTcB 406/425 ms   Echo- 04/04/19 1. Left ventricular ejection fraction, by visual estimation, is 55 to 60%. The left ventricle has normal function. Normal left ventricular size. There is mildly increased left ventricular hypertrophy.   2. Left ventricular diastolic Doppler parameters are consistent with  impaired relaxation pattern of LV diastolic filling.   3. Global right ventricle has normal systolic function.The right  ventricular size is normal. No increase in right ventricular wall  thickness.   4. Left atrial size was normal.   5. Right atrial size was normal.   6. Moderate aortic valve annular calcification.   7. The mitral valve is grossly normal. Trace mitral valve regurgitation.   8. The tricuspid valve is abnormal.  Tricuspid valve regurgitation  moderate-severe.   9. The tricuspid valve is thickened and appears to incompletely coapt.  10. The aortic valve is tricuspid Aortic valve regurgitation was not  visualized by color flow Doppler.  11. The pulmonic valve was grossly normal. Pulmonic valve regurgitation is trivial by color flow Doppler.  12. Normal pulmonary artery systolic pressure.  13. The tricuspid regurgitant velocity is 2.52 m/s, and with an assumed right atrial pressure of 3 mmHg, the estimated right ventricular systolic pressure is normal at 28.4 mmHg.  14. The inferior vena cava is normal in size with greater than 50%  respiratory variability, suggesting right atrial pressure of 3 mmHg.    CHA2DS2-VASc Score = 5  The  patient's score is based upon: CHF History: 1 HTN History: 1 Diabetes History: 0 Stroke History: 0 Vascular Disease History: 0 Age Score: 2 Gender Score: 1       ASSESSMENT AND PLAN: Atypical atrial flutter The patient's CHA2DS2-VASc score is 5, indicating a 7.2% annual risk of stroke.   S/p dofetilide admission 07/2022 Patient appears to be maintaining SR Continue dofetilide 250 mcg BID Continue Eliquis 5 mg BID Continue carvedilol 25 mg BID Continue diltiazem 120 mg daily  Secondary Hypercoagulable State (ICD10:  D68.69) The patient is at significant risk for stroke/thromboembolism based upon her CHA2DS2-VASc Score of 5.  Continue Apixaban (Eliquis). No bleeding issues.   High Risk Medication Monitoring (ICD 10: Z79.899) QT interval on ECG acceptable for dofetilide monitoring. Check bmet/mag/cbc today.   HTN Elevated today. Has had labile readings at home ranging from 130-170 systolic. She is on valsartan, diltiazem, and carvedilol. Will refer her to HTN Clinic to see if her medications can be further optimized.    Follow up in the AF clinic in 6 months.    Jorja Loa PA-C Afib Clinic [facility] [address] [place], [place]  [address] [phone]

## 2023-10-21 DIAGNOSIS — N3020 Other chronic cystitis without hematuria: Secondary | ICD-10-CM | POA: Diagnosis not present

## 2023-11-10 DIAGNOSIS — Z0000 Encounter for general adult medical examination without abnormal findings: Secondary | ICD-10-CM | POA: Diagnosis not present

## 2023-11-10 DIAGNOSIS — I10 Essential (primary) hypertension: Secondary | ICD-10-CM | POA: Diagnosis not present

## 2023-11-10 DIAGNOSIS — H6122 Impacted cerumen, left ear: Secondary | ICD-10-CM | POA: Diagnosis not present

## 2023-11-10 DIAGNOSIS — M170 Bilateral primary osteoarthritis of knee: Secondary | ICD-10-CM | POA: Diagnosis not present

## 2023-11-10 DIAGNOSIS — I4891 Unspecified atrial fibrillation: Secondary | ICD-10-CM | POA: Diagnosis not present

## 2023-12-16 DIAGNOSIS — N3020 Other chronic cystitis without hematuria: Secondary | ICD-10-CM | POA: Diagnosis not present

## 2023-12-19 DIAGNOSIS — Z947 Corneal transplant status: Secondary | ICD-10-CM | POA: Diagnosis not present

## 2023-12-19 DIAGNOSIS — H04123 Dry eye syndrome of bilateral lacrimal glands: Secondary | ICD-10-CM | POA: Diagnosis not present

## 2023-12-19 DIAGNOSIS — H02403 Unspecified ptosis of bilateral eyelids: Secondary | ICD-10-CM | POA: Diagnosis not present

## 2023-12-19 DIAGNOSIS — Z961 Presence of intraocular lens: Secondary | ICD-10-CM | POA: Diagnosis not present

## 2023-12-19 DIAGNOSIS — H59011 Keratopathy (bullous aphakic) following cataract surgery, right eye: Secondary | ICD-10-CM | POA: Diagnosis not present

## 2023-12-25 ENCOUNTER — Institutional Professional Consult (permissible substitution) (HOSPITAL_BASED_OUTPATIENT_CLINIC_OR_DEPARTMENT_OTHER): Admitting: Family

## 2024-01-06 ENCOUNTER — Other Ambulatory Visit (HOSPITAL_COMMUNITY): Payer: Self-pay | Admitting: Physician Assistant

## 2024-03-27 ENCOUNTER — Other Ambulatory Visit (HOSPITAL_COMMUNITY): Payer: Self-pay | Admitting: Physician Assistant

## 2024-04-07 ENCOUNTER — Ambulatory Visit (HOSPITAL_COMMUNITY)
Admission: RE | Admit: 2024-04-07 | Discharge: 2024-04-07 | Disposition: A | Discharge status: Home / Self Care | Source: Ambulatory Visit | Attending: Physician Assistant | Admitting: Physician Assistant

## 2024-04-07 VITALS — BP 142/64 | HR 69 | Ht 66.0 in | Wt 155.6 lb

## 2024-04-07 DIAGNOSIS — D6869 Other thrombophilia: Secondary | ICD-10-CM | POA: Diagnosis not present

## 2024-04-07 DIAGNOSIS — I484 Atypical atrial flutter: Secondary | ICD-10-CM | POA: Diagnosis not present

## 2024-04-07 DIAGNOSIS — I4891 Unspecified atrial fibrillation: Secondary | ICD-10-CM | POA: Diagnosis not present

## 2024-04-07 DIAGNOSIS — Z5181 Encounter for therapeutic drug level monitoring: Secondary | ICD-10-CM

## 2024-04-07 DIAGNOSIS — Z79899 Other long term (current) drug therapy: Secondary | ICD-10-CM | POA: Diagnosis not present

## 2024-04-07 NOTE — Progress Notes (Signed)
 Primary Care Physician: Claudene Pellet, MD Referring Physician: ED f/u Primary EP: Dr Waddell Patient Michelle Mosley is a 89 y.o. female with a h/o HTN, DVT rt leg, LEE, osteoarthritis of both knees, newly dx paroxsymal afib that is in the Afib clinic for f/u ED visit. She had seen her PCP for tachycardia and LEE. Labs drawn showed a BNP of 500 and a troponin of 20 so she was called at home and told to go to the ED. She was started on eliquis  at PCP as EKG showed atrial flutter.she was already on daily lasix  but it was increased in the ED for suspicion  of mild CHF. She  did not fit the picture of ACS.   She was asked to f/u in the Afib clinic. Today, she is in rate controlled atrial flutter. She states she lost  6-7 lbs with increase of lasix  to 40 mg a day and her LEE is much improved. Her rt leg usually swells more that the left for arthritis of the knee. She does not describe any dyspnea. She is wearing a 2 week zio patch that was placed at the PCP office that will come off 12/6. She has a previously scheduled appointment pending with Dr. JUDITHANN Waddell 06/27/22. She is compliant with eliquis . She was already on carvedilol  25 mg bid and diltiazem  120 mg daily.   Follow up in the AF clinic 08/12/22. Patient presents for dofetilide  admission today. She is in SR today. She denies any missed doses of anticoagulation.   Follow up in the AF clinic 08/22/22. Patient is s/p dofetilide  loading 1/29-08/15/22. Patient reports that she has done well since her hospital stay. She does not feel much different but she was in SR on admission. No bleeding issues on anticoagulation.   Follow up in the AF clinic 09/25/22. Patient reports that she has done well since her last visit. She is not aware of any interim episodes of afib. No bleeding issues on anticoagulation.   Follow up in the AF clinic 04/08/23. Patient reports that she has felt well since her last visit. She remains in SR, no symptoms of afib. No bleeding issues on  anticoagulation.   Follow up 10/08/23. Patient returns for follow up for atrial fibrillation and dofetilide  monitoring. She remains in SR today. She did have some fluttering in her chest a few weeks ago in the setting of a UTI. The symptoms have resolved. She has noted labile BP readings at home ranging from 130-170 systolic. No bleeding issues on anticoagulation.   Follow up 04/07/24. Patient returns for follow up for atrial fibrillation and dofetilide  monitoring. She remains in SR today and feels well. She denies any interim symptoms of afib. No bleeding issues on anticoagulation.   Today, she  denies symptoms of palpitations, chest pain, shortness of breath, orthopnea, PND, dizziness, presyncope, syncope, snoring, daytime somnolence, bleeding, or neurologic sequela. The patient is tolerating medications without difficulties and is otherwise without complaint today.    Past Medical History:  Diagnosis Date   Arthritis    DVT (deep venous thrombosis) (HCC)    Hypertension     Current Outpatient Medications  Medication Sig Dispense Refill   busPIRone  (BUSPAR ) 5 MG tablet Take 5 mg by mouth 2 (two) times daily.     carvedilol  (COREG ) 25 MG tablet Take 25 mg by mouth 2 (two) times daily with a meal.     Cholecalciferol  (VITAMIN D ) 2000 units CAPS Take 1 capsule by mouth daily.  diltiazem  (CARDIZEM ) 120 MG tablet Take 120 mg by mouth every morning.     dofetilide  (TIKOSYN ) 250 MCG capsule TAKE ONE (1) CAPSULE BY MOUTH TWICE DAILY 180 capsule 3   ELIQUIS  5 MG TABS tablet Take 5 mg by mouth 2 (two) times daily.     estradiol (ESTRACE) 0.1 MG/GM vaginal cream Place 1 Applicatorful vaginally 3 (three) times a week. 1 application on Monday Wednesday and Fridays     furosemide  (LASIX ) 20 MG tablet Take 20 mg by mouth 2 (two) times daily. (Patient taking differently: Take 20 mg by mouth daily.)     metroNIDAZOLE (METROCREAM) 0.75 % cream Apply 1 Application topically daily as needed (For face  rash).     Polyethylene Glycol 3350  (EQ CLEARLAX PO) She uses twice daily     Polyvinyl Alcohol-Povidone (REFRESH OP) Place 1 drop into both eyes in the morning, at noon, and at bedtime.     potassium chloride  (KLOR-CON ) 10 MEQ tablet Take 1 tablet (10 mEq total) by mouth daily. 90 tablet 3   valsartan  (DIOVAN ) 320 MG tablet Take 320 mg by mouth daily.     No current facility-administered medications for this encounter.    ROS- All systems are reviewed and negative except as per the HPI above  Physical Exam: Vitals:   04/07/24 1347  BP: (!) 142/64  Pulse: 69  Weight: 70.6 kg  Height: 5' 6 (1.676 m)      Wt Readings from Last 3 Encounters:  04/07/24 70.6 kg  10/08/23 70.6 kg  04/08/23 69.2 kg    GEN: Well nourished, well developed in no acute distress NECK: No JVD; No carotid bruits CARDIAC: Regular rate and rhythm, no murmurs, rubs, gallops RESPIRATORY:  Clear to auscultation without rales, wheezing or rhonchi  ABDOMEN: Soft, non-tender, non-distended EXTREMITIES:  chronic lower extremity edema, compression stockings in place, No deformity    EKG today demonstrates SR, 1st degree AV block Vent. rate 69 BPM PR interval 228 ms QRS duration 82 ms QT/QTcB 396/424 ms   Echo- 04/04/19 1. Left ventricular ejection fraction, by visual estimation, is 55 to 60%. The left ventricle has normal function. Normal left ventricular size. There is mildly increased left ventricular hypertrophy.   2. Left ventricular diastolic Doppler parameters are consistent with  impaired relaxation pattern of LV diastolic filling.   3. Global right ventricle has normal systolic function.The right  ventricular size is normal. No increase in right ventricular wall  thickness.   4. Left atrial size was normal.   5. Right atrial size was normal.   6. Moderate aortic valve annular calcification.   7. The mitral valve is grossly normal. Trace mitral valve regurgitation.   8. The tricuspid valve is  abnormal. Tricuspid valve regurgitation  moderate-severe.   9. The tricuspid valve is thickened and appears to incompletely coapt.  10. The aortic valve is tricuspid Aortic valve regurgitation was not  visualized by color flow Doppler.  11. The pulmonic valve was grossly normal. Pulmonic valve regurgitation is trivial by color flow Doppler.  12. Normal pulmonary artery systolic pressure.  13. The tricuspid regurgitant velocity is 2.52 m/s, and with an assumed right atrial pressure of 3 mmHg, the estimated right ventricular systolic pressure is normal at 28.4 mmHg.  14. The inferior vena cava is normal in size with greater than 50%  respiratory variability, suggesting right atrial pressure of 3 mmHg.    CHA2DS2-VASc Score = 5  The patient's score is based upon: CHF History: 1  HTN History: 1 Diabetes History: 0 Stroke History: 0 Vascular Disease History: 0 Age Score: 2 Gender Score: 1       ASSESSMENT AND PLAN: Atypical atrial flutter The patient's CHA2DS2-VASc score is 5, indicating a 7.2% annual risk of stroke.   S/p dofetilide  admission 07/2022 Patient appears to be maintaining SR Continue dofetilide  250 mcg BID Continue Eliquis  5 mg BID Continue carvedilol  25 mg BID Continue diltiazem  120 mg daily  Secondary Hypercoagulable State (ICD10:  D68.69) The patient is at significant risk for stroke/thromboembolism based upon her CHA2DS2-VASc Score of 5.  Continue Apixaban  (Eliquis ). No bleeding issues.   High Risk Medication Monitoring (ICD 10: [id number]) Patient requires ongoing monitoring for anti-arrhythmic medication which has the potential to cause life threatening arrhythmias. QT interval on ECG acceptable for dofetilide  monitoring. Check bmet/mag today.     HTN Stable on current regimen   Follow up in the AF clinic in 6 months.    Daril Kicks PA-C Afib Clinic [facility] [address] [place], [place] [address] [phone]

## 2024-04-08 ENCOUNTER — Ambulatory Visit (HOSPITAL_COMMUNITY): Payer: Self-pay | Admitting: Physician Assistant

## 2024-04-08 LAB — BASIC METABOLIC PANEL WITH GFR
BUN/Creatinine Ratio: 30 — ABNORMAL HIGH (ref 12–28)
BUN: 20 mg/dL (ref 8–27)
CO2: 26 mmol/L (ref 20–29)
Calcium: 9 mg/dL (ref 8.7–10.3)
Chloride: 95 mmol/L — ABNORMAL LOW (ref 96–106)
Creatinine, Ser: 0.67 mg/dL (ref 0.57–1.00)
Glucose: 85 mg/dL (ref 70–99)
Potassium: 5.3 mmol/L — ABNORMAL HIGH (ref 3.5–5.2)
Sodium: 133 mmol/L — ABNORMAL LOW (ref 134–144)
eGFR: 83 mL/min/1.73 (ref >59)

## 2024-04-08 LAB — MAGNESIUM: Magnesium: 2.1 mg/dL (ref 1.6–2.3)

## 2024-08-19 ENCOUNTER — Other Ambulatory Visit (HOSPITAL_COMMUNITY): Payer: Self-pay

## 2024-08-19 MED ORDER — ELIQUIS 5 MG PO TABS: 5.0000 mg | ORAL_TABLET | Freq: Two times a day (BID) | ORAL | 11 refills | Status: AC

## 2024-08-19 MED ORDER — DOFETILIDE 250 MCG PO CAPS: 250.0000 ug | ORAL_CAPSULE | Freq: Two times a day (BID) | ORAL | 3 refills | Status: AC

## 2024-09-06 ENCOUNTER — Ambulatory Visit (HOSPITAL_COMMUNITY): Admitting: Physician Assistant

## 2024-10-04 ENCOUNTER — Ambulatory Visit (HOSPITAL_COMMUNITY): Admitting: Physician Assistant
